# Patient Record
Sex: Female | Born: 1965 | Race: Black or African American | Hispanic: No | Marital: Married | Smoking: Never smoker
Health system: Southern US, Community
[De-identification: ages and names within clinical notes are randomized; demographics above are authoritative.]

## PROBLEM LIST (undated history)

## (undated) DIAGNOSIS — M1990 Unspecified osteoarthritis, unspecified site: Secondary | ICD-10-CM

---

## 2018-09-07 ENCOUNTER — Emergency Department (HOSPITAL_COMMUNITY)
Admission: EM | Admit: 2018-09-07 | Discharge: 2018-09-07 | Disposition: A | Discharge status: Home / Self Care | Payer: Medicaid - Out of State | Attending: Emergency Medicine | Admitting: Emergency Medicine

## 2018-09-07 ENCOUNTER — Encounter (HOSPITAL_COMMUNITY): Payer: Self-pay | Admitting: Emergency Medicine

## 2018-09-07 ENCOUNTER — Other Ambulatory Visit: Payer: Self-pay

## 2018-09-07 DIAGNOSIS — M546 Pain in thoracic spine: Secondary | ICD-10-CM | POA: Insufficient documentation

## 2018-09-07 HISTORY — DX: Unspecified osteoarthritis, unspecified site: M19.90

## 2018-09-07 LAB — CBC
HCT: 40.1 % (ref 36.0–46.0)
Hemoglobin: 12.7 g/dL (ref 12.0–15.0)
MCH: 30.8 pg (ref 26.0–34.0)
MCHC: 31.7 g/dL (ref 30.0–36.0)
MCV: 97.3 fL (ref 80.0–100.0)
Platelets: 300 10*3/uL (ref 150–400)
RBC: 4.12 MIL/uL (ref 3.87–5.11)
RDW: 12.6 % (ref 11.5–15.5)
WBC: 11.7 10*3/uL — ABNORMAL HIGH (ref 4.0–10.5)
nRBC: 0 % (ref 0.0–0.2)

## 2018-09-07 LAB — COMPREHENSIVE METABOLIC PANEL
ALT: 17 U/L (ref 0–44)
AST: 19 U/L (ref 15–41)
Albumin: 3.9 g/dL (ref 3.5–5.0)
Alkaline Phosphatase: 60 U/L (ref 38–126)
Anion gap: 7 (ref 5–15)
BUN: 10 mg/dL (ref 6–20)
CO2: 27 mmol/L (ref 22–32)
Calcium: 9.7 mg/dL (ref 8.9–10.3)
Chloride: 107 mmol/L (ref 98–111)
Creatinine, Ser: 0.79 mg/dL (ref 0.44–1.00)
GFR calc Af Amer: >60 mL/min (ref >60)
GFR calc non Af Amer: >60 mL/min (ref >60)
Glucose, Bld: 87 mg/dL (ref 70–99)
Potassium: 3.7 mmol/L (ref 3.5–5.1)
Sodium: 141 mmol/L (ref 135–145)
Total Bilirubin: 0.3 mg/dL (ref 0.3–1.2)
Total Protein: 7.7 g/dL (ref 6.5–8.1)

## 2018-09-07 LAB — LIPASE, BLOOD: Lipase: 39 U/L (ref 11–51)

## 2018-09-07 LAB — I-STAT TROPONIN, ED: Troponin i, poc: 0 ng/mL (ref 0.00–0.08)

## 2018-09-07 MED ORDER — METHOCARBAMOL 500 MG PO TABS: 1000.0000 mg | ORAL_TABLET | Freq: Four times a day (QID) | ORAL | 0 refills | Status: DC

## 2018-09-07 MED ORDER — NAPROXEN 500 MG PO TABS: 500.0000 mg | ORAL_TABLET | Freq: Two times a day (BID) | ORAL | 0 refills | Status: DC

## 2018-09-07 MED ORDER — KETOROLAC TROMETHAMINE 30 MG/ML IJ SOLN
30.0000 mg | Freq: Once | INTRAMUSCULAR | Status: AC
  Administered 2018-09-07: 30 mg via INTRAMUSCULAR
  Filled 2018-09-07: qty 1

## 2018-09-07 NOTE — ED Triage Notes (Signed)
Pt complaint of mid back/abdominal pain onset Saturday; worse with movement; denies n/v/d. Denies strenuous activity or back pain.

## 2018-09-07 NOTE — ED Provider Notes (Signed)
[facility]-EMERGENCY DEPT Provider Note   CSN: [id number] Arrival date & time: 09/07/18  1221     History   Chief Complaint Chief Complaint  Patient presents with  . Abdominal Pain  . Back Pain    HPI Melissa Wu is a 52 y.o. female.  Patient presents the emergency department with 3-day history of bilateral middle back pain that radiates around to the top of her abdomen.  Patient states that prior to this she had a sinus infection and was sneezing a lot.  She denies any acute injuries to the area.  She denies any chest pain, shortness of breath, diaphoresis.  She is not had any numbness, weakness, or tingling in her arms or her legs.  Pain is much worse with movement and palpation.  She has taken Tylenol and applied CBD cream with minimal relief.  No fever or cough.  Patient is ambulatory without any difficulty.     Past Medical History:  Diagnosis Date  . Osteoarthritis     There are no active problems to display for this patient.   The histories are not reviewed yet. Please review them in the "History" navigator section and refresh this SmartLink.   OB History   No obstetric history on file.      Home Medications    Prior to Admission medications   Medication Sig Start Date End Date Taking? Authorizing Provider  methocarbamol (ROBAXIN) 500 MG tablet Take 2 tablets (1,000 mg total) by mouth 4 (four) times daily. 09/07/18   Renne CriglerGeiple, Safi Culotta, PA-C  naproxen (NAPROSYN) 500 MG tablet Take 1 tablet (500 mg total) by mouth 2 (two) times daily. 09/07/18   Renne CriglerGeiple, Tangi Shroff, PA-C    Family History No family history on file.  Social History Social History   Tobacco Use  . Smoking status: Not on file  Substance Use Topics  . Alcohol use: Not on file  . Drug use: Not on file     Allergies   Patient has no allergy information on record.   Review of Systems Review of Systems  Constitutional: Negative for fever and unexpected weight change.   Gastrointestinal: Positive for abdominal pain. Negative for constipation.       Negative for fecal incontinence.   Genitourinary: Negative for dysuria, flank pain and hematuria.       Negative for urinary incontinence or retention.  Musculoskeletal: Positive for back pain.  Neurological: Negative for weakness and numbness.       Denies saddle paresthesias.     Physical Exam Updated Vital Signs BP (!) 142/113 (BP Location: Right Arm)   Pulse 86   Temp 98.2 F (36.8 C) (Oral)   Resp 18   SpO2 99%   Physical Exam Vitals signs and nursing note reviewed.  Constitutional:      Appearance: She is well-developed.  HENT:     Head: Normocephalic and atraumatic.  Eyes:     Conjunctiva/sclera: Conjunctivae normal.  Neck:     Musculoskeletal: Normal range of motion and neck supple.  Pulmonary:     Effort: Pulmonary effort is normal.  Abdominal:     Palpations: Abdomen is soft.     Tenderness: There is no abdominal tenderness.  Musculoskeletal:     Thoracic back: She exhibits decreased range of motion, tenderness and spasm. She exhibits no bony tenderness.       Back:     Comments: No step-off noted with palpation of spine.   Skin:    General: Skin  is warm and dry.     Findings: No rash.  Neurological:     Mental Status: She is alert.     Sensory: No sensory deficit.     Deep Tendon Reflexes: Reflexes are normal and symmetric.     Comments: 5/5 strength in entire lower extremities bilaterally. No sensation deficit.       ED Treatments / Results  Labs (all labs ordered are listed, but only abnormal results are displayed) Labs Reviewed  CBC - Abnormal; Notable for the following components:      Result Value   WBC 11.7 (*)    All other components within normal limits  LIPASE, BLOOD  COMPREHENSIVE METABOLIC PANEL  I-STAT TROPONIN, ED   ED ECG REPORT   Date: 09/07/2018  Rate: 65  Rhythm: normal sinus rhythm  QRS Axis: normal  Intervals: normal  ST/T Wave  abnormalities: normal  Conduction Disutrbances:none  Narrative Interpretation:   Old EKG Reviewed: none available  I have personally reviewed the EKG tracing and agree with the computerized printout as noted.   Radiology No results found.  Procedures Procedures (including critical care time)  Medications Ordered in ED Medications  ketorolac (TORADOL) 30 MG/ML injection 30 mg (has no administration in time range)     Initial Impression / Assessment and Plan / ED Course  I have reviewed the triage vital signs and the nursing notes.  Pertinent labs & imaging results that were available during my care of the patient were reviewed by me and considered in my medical decision making (see chart for details).     Patient seen and examined.  Symptoms consistent with musculoskeletal pain.  Work-up reviewed and is reassuring.  We will treat conservatively with Robaxin and NSAIDs.  Also encouraged heat, gentle stretching, and rest.  Vital signs reviewed and are as follows: BP (!) 142/113 (BP Location: Right Arm)   Pulse 86   Temp 98.2 F (36.8 C) (Oral)   Resp 18   SpO2 99%   Patient counseled on proper use of muscle relaxant medication.  They were told not to drink alcohol, drive any vehicle, or do any dangerous activities while taking this medication.  Patient verbalized understanding.   Final Clinical Impressions(s) / ED Diagnoses   Final diagnoses:  Acute bilateral thoracic back pain   Patient with back pain.  Possibly from overuse due to recent upper respiratory infection.  No neurological deficits. Patient is ambulatory. No warning symptoms of back pain including: fecal incontinence, urinary retention or overflow incontinence, night sweats, waking from sleep with back pain, unexplained fevers or weight loss, h/o cancer, IVDU, recent trauma. No concern for cauda equina, epidural abscess, or other serious cause of back pain. Conservative measures such as rest, ice/heat and pain  medicine indicated with PCP follow-up if no improvement with conservative management.  Work-up was reassuring with only mild leukocytosis.  Patient has no abdominal pain at time of exam and no signs of skin infection.   ED Discharge Orders         Ordered    methocarbamol (ROBAXIN) 500 MG tablet  4 times daily     09/07/18 1845    naproxen (NAPROSYN) 500 MG tablet  2 times daily     09/07/18 1845           Renne CriglerGeiple, Kaylee Wombles, PA-C 09/07/18 1857    Tilden Fossaees, Elizabeth, MD 09/07/18 2352

## 2018-09-07 NOTE — Discharge Instructions (Signed)
Please read and follow all provided instructions.  Your diagnoses today include:  1. Acute bilateral thoracic back pain     Tests performed today include:  Vital signs - see below for your results today  Blood counts and electrolytes  EKG  Blood test for your heart -not show any signs of heart attack  Medications prescribed:   Robaxin (methocarbamol) - muscle relaxer medication  DO NOT drive or perform any activities that require you to be awake and alert because this medicine can make you drowsy.    Naproxen - anti-inflammatory pain medication  Do not exceed 500mg  naproxen every 12 hours, take with food  You have been prescribed an anti-inflammatory medication or NSAID. Take with food. Take smallest effective dose for the shortest duration needed for your pain. Stop taking if you experience stomach pain or vomiting.   Take any prescribed medications only as directed.  Home care instructions:   Follow any educational materials contained in this packet  Please rest, use ice or heat on your back for the next several days  Do not lift, push, pull anything more than 10 pounds for the next week  Follow-up instructions: Please follow-up with your primary care provider in the next 1 week for further evaluation of your symptoms.   Return instructions:  SEEK IMMEDIATE MEDICAL ATTENTION IF YOU HAVE:  New numbness, tingling, weakness, or problem with the use of your arms or legs  Severe back pain not relieved with medications  Loss control of your bowels or bladder  Increasing pain in any areas of the body (such as chest or abdominal pain)  Shortness of breath, dizziness, or fainting.   Worsening nausea (feeling sick to your stomach), vomiting, fever, or sweats  Any other emergent concerns regarding your health   Additional Information:  Your vital signs today were: BP (!) 142/113 (BP Location: Right Arm)    Pulse 86    Temp 98.2 F (36.8 C) (Oral)    Resp 18     SpO2 99%  If your blood pressure (BP) was elevated above 135/85 this visit, please have this repeated by your doctor within one month. --------------

## 2018-11-17 ENCOUNTER — Other Ambulatory Visit: Payer: Self-pay | Admitting: Family Medicine

## 2018-11-17 DIAGNOSIS — Z1231 Encounter for screening mammogram for malignant neoplasm of breast: Secondary | ICD-10-CM

## 2018-11-17 DIAGNOSIS — E2839 Other primary ovarian failure: Secondary | ICD-10-CM

## 2019-01-14 ENCOUNTER — Ambulatory Visit: Payer: Medicaid - Out of State

## 2019-01-14 ENCOUNTER — Other Ambulatory Visit: Payer: Medicaid - Out of State

## 2019-12-15 ENCOUNTER — Other Ambulatory Visit: Payer: Self-pay | Admitting: Family Medicine

## 2019-12-15 DIAGNOSIS — E2839 Other primary ovarian failure: Secondary | ICD-10-CM

## 2020-01-06 ENCOUNTER — Ambulatory Visit
Admission: RE | Admit: 2020-01-06 | Discharge: 2020-01-06 | Disposition: A | Discharge status: Home / Self Care | Payer: Medicaid Other | Source: Ambulatory Visit | Attending: Family Medicine | Admitting: Family Medicine

## 2020-01-06 ENCOUNTER — Other Ambulatory Visit: Payer: Self-pay

## 2020-01-06 DIAGNOSIS — Z1231 Encounter for screening mammogram for malignant neoplasm of breast: Secondary | ICD-10-CM

## 2020-04-10 ENCOUNTER — Other Ambulatory Visit: Payer: Self-pay

## 2020-04-10 ENCOUNTER — Ambulatory Visit
Admission: RE | Admit: 2020-04-10 | Discharge: 2020-04-10 | Disposition: A | Discharge status: Home / Self Care | Payer: Medicaid Other | Source: Ambulatory Visit | Attending: Family Medicine | Admitting: Family Medicine

## 2020-04-10 DIAGNOSIS — E2839 Other primary ovarian failure: Secondary | ICD-10-CM

## 2021-01-03 ENCOUNTER — Other Ambulatory Visit: Payer: Self-pay | Admitting: Family Medicine

## 2021-01-03 DIAGNOSIS — Z1231 Encounter for screening mammogram for malignant neoplasm of breast: Secondary | ICD-10-CM

## 2021-02-23 ENCOUNTER — Ambulatory Visit: Payer: Medicaid Other

## 2021-06-19 ENCOUNTER — Ambulatory Visit
Admission: RE | Admit: 2021-06-19 | Discharge: 2021-06-19 | Disposition: A | Discharge status: Home / Self Care | Payer: Medicaid Other | Source: Ambulatory Visit | Attending: Family Medicine | Admitting: Family Medicine

## 2021-06-19 ENCOUNTER — Other Ambulatory Visit: Payer: Self-pay

## 2021-06-19 DIAGNOSIS — Z1231 Encounter for screening mammogram for malignant neoplasm of breast: Secondary | ICD-10-CM

## 2021-11-27 ENCOUNTER — Other Ambulatory Visit: Payer: Self-pay | Admitting: Obstetrics and Gynecology

## 2021-11-27 DIAGNOSIS — M8580 Other specified disorders of bone density and structure, unspecified site: Secondary | ICD-10-CM

## 2022-03-27 IMAGING — MG MM DIGITAL SCREENING BILAT W/ TOMO AND CAD
8 series · 8 of 24 positions shown · non-contrast
Comparison: Previous exam(s).

CLINICAL DATA: Screening.

EXAM:
DIGITAL SCREENING BILATERAL MAMMOGRAM WITH TOMOSYNTHESIS AND CAD
TECHNIQUE: Bilateral screening digital craniocaudal and mediolateral oblique
mammograms were obtained. Bilateral screening digital breast
tomosynthesis was performed. The images were evaluated with
computer-aided detection.

[L MLO synth-2D]
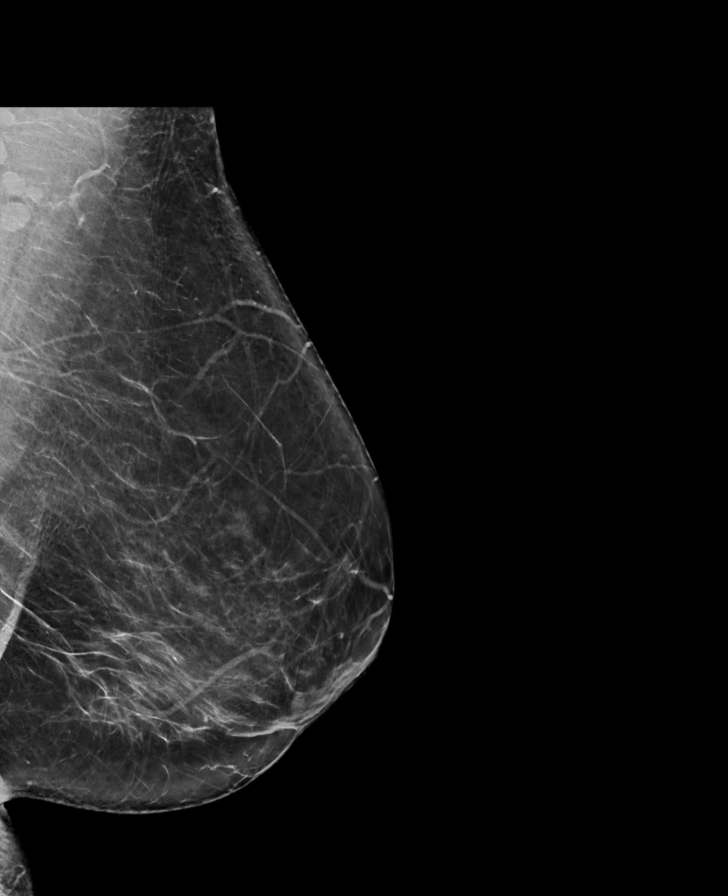

[R MLO synth-2D]
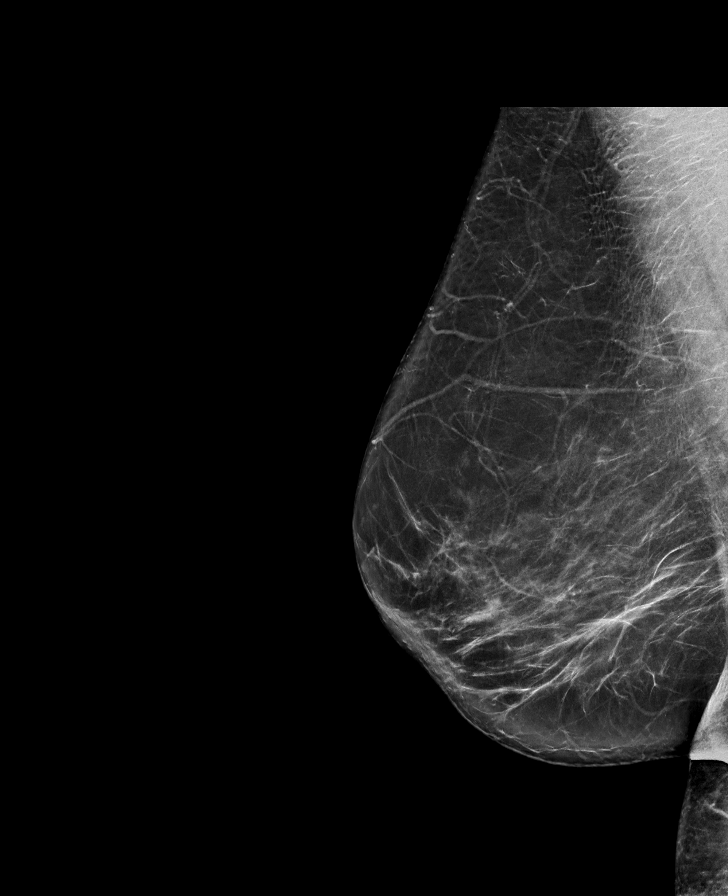

[R CC synth-2D]
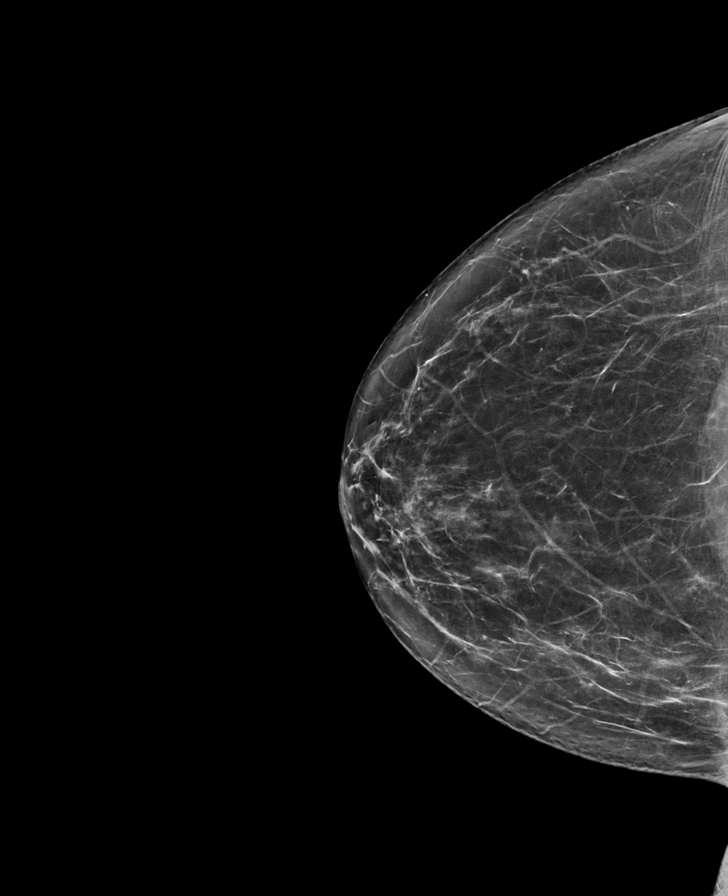

[L CC synth-2D]
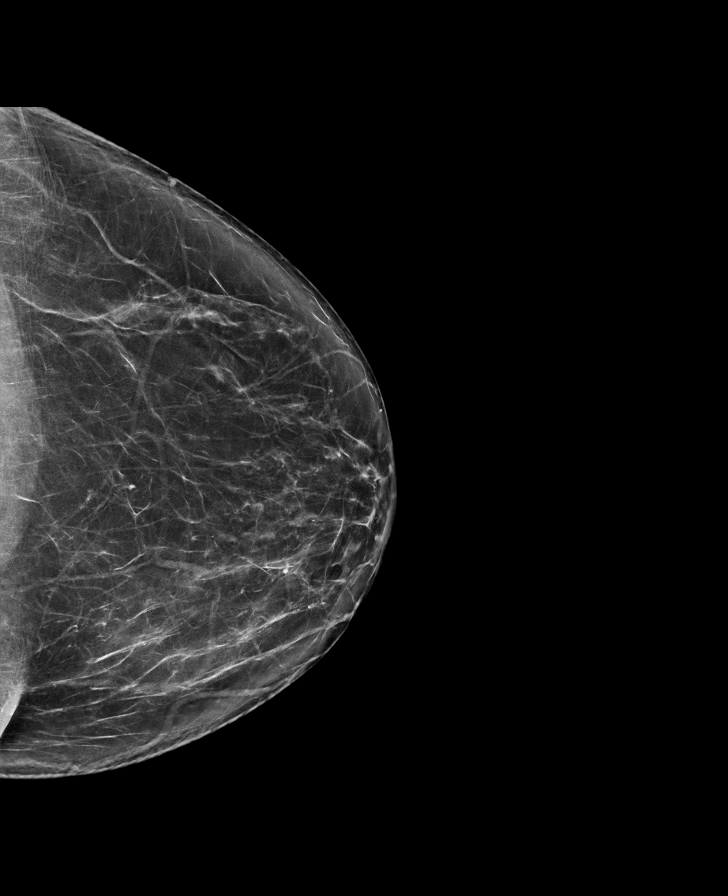

[L CC tomo · tomo slice 41/80.0]
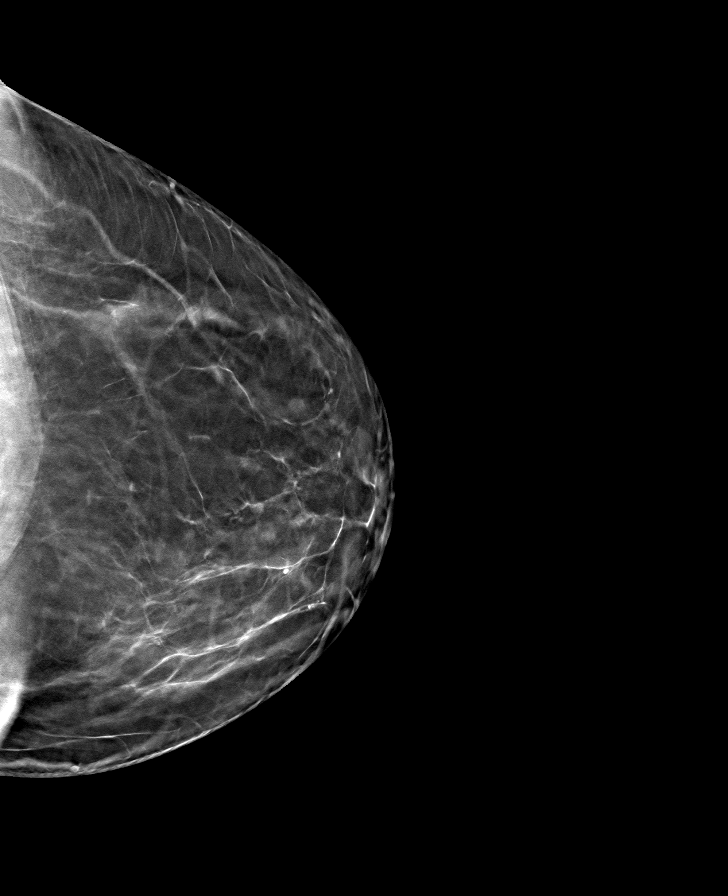

[R CC tomo · tomo slice 39/77.0]
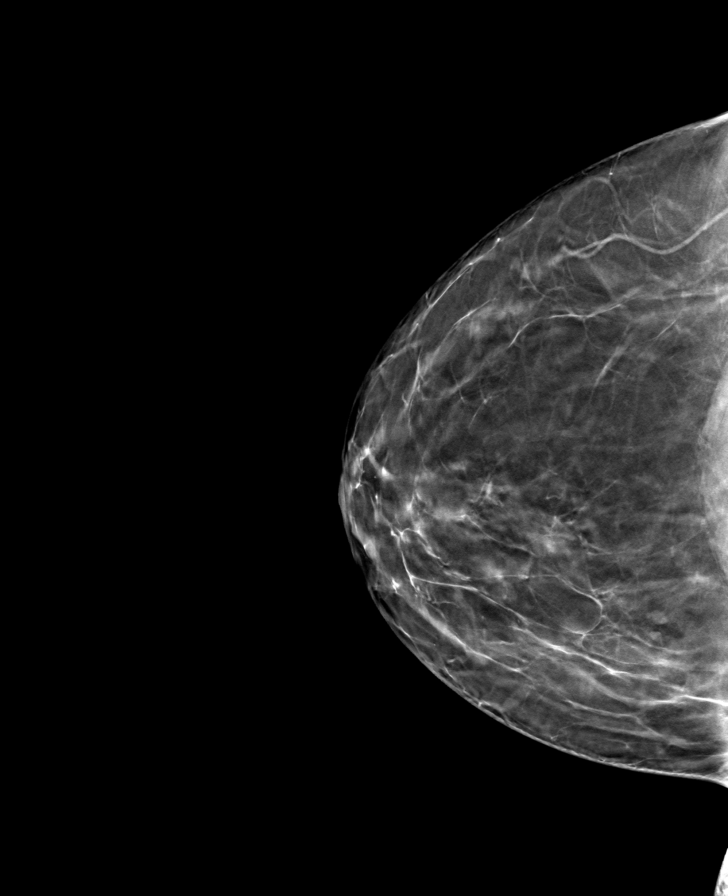

[R MLO tomo · tomo slice 44/87.0]
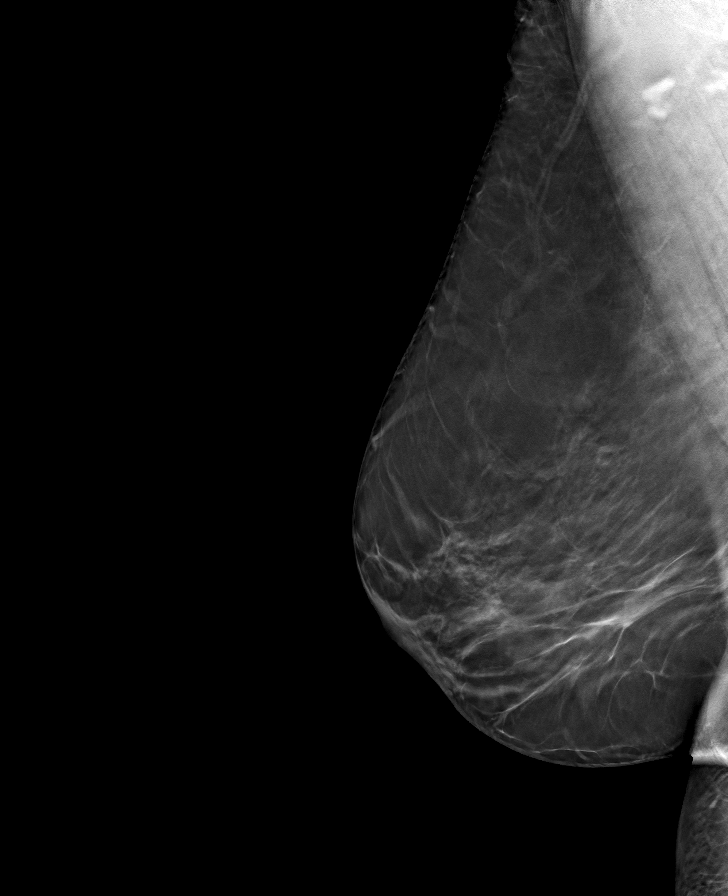

[L MLO tomo · tomo slice 43/85.0]
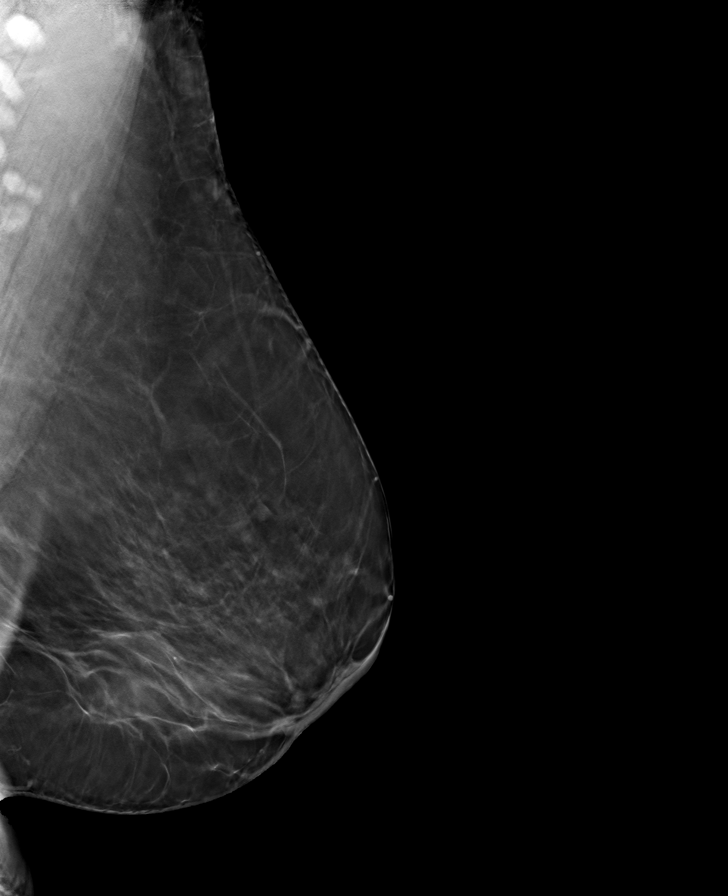

[8 of 24 positions shown; findings below may reference images not displayed]

ACR Breast Density Category b: There are scattered areas of
fibroglandular density.
FINDINGS: There are no findings suspicious for malignancy.
IMPRESSION: No mammographic evidence of malignancy. A result letter of this
screening mammogram will be mailed directly to the patient.

RECOMMENDATION:
Screening mammogram in one year. (Code:51-O-LD2)

BI-RADS CATEGORY  1: Negative.

## 2022-05-10 ENCOUNTER — Other Ambulatory Visit: Payer: Medicaid Other

## 2022-08-16 ENCOUNTER — Other Ambulatory Visit: Payer: Self-pay | Admitting: Obstetrics and Gynecology

## 2022-08-16 DIAGNOSIS — Z1231 Encounter for screening mammogram for malignant neoplasm of breast: Secondary | ICD-10-CM

## 2022-09-20 ENCOUNTER — Inpatient Hospital Stay: Admission: RE | Admit: 2022-09-20 | Payer: Medicaid Other | Source: Ambulatory Visit

## 2022-10-01 ENCOUNTER — Ambulatory Visit
Admission: RE | Admit: 2022-10-01 | Discharge: 2022-10-01 | Disposition: A | Discharge status: Home / Self Care | Payer: Medicaid Other | Source: Ambulatory Visit | Attending: Obstetrics and Gynecology | Admitting: Obstetrics and Gynecology

## 2022-10-01 DIAGNOSIS — M8580 Other specified disorders of bone density and structure, unspecified site: Secondary | ICD-10-CM

## 2022-10-14 ENCOUNTER — Ambulatory Visit
Admission: RE | Admit: 2022-10-14 | Discharge: 2022-10-14 | Disposition: A | Discharge status: Home / Self Care | Payer: Medicaid Other | Source: Ambulatory Visit | Attending: Obstetrics and Gynecology | Admitting: Obstetrics and Gynecology

## 2022-10-14 DIAGNOSIS — Z1231 Encounter for screening mammogram for malignant neoplasm of breast: Secondary | ICD-10-CM

## 2023-03-19 DIAGNOSIS — Z780 Asymptomatic menopausal state: Secondary | ICD-10-CM | POA: Diagnosis not present

## 2023-03-19 DIAGNOSIS — M8580 Other specified disorders of bone density and structure, unspecified site: Secondary | ICD-10-CM | POA: Diagnosis not present

## 2023-03-19 DIAGNOSIS — Z01419 Encounter for gynecological examination (general) (routine) without abnormal findings: Secondary | ICD-10-CM | POA: Diagnosis not present

## 2023-05-04 DIAGNOSIS — Z809 Family history of malignant neoplasm, unspecified: Secondary | ICD-10-CM | POA: Diagnosis not present

## 2023-05-04 DIAGNOSIS — Z5986 Financial insecurity: Secondary | ICD-10-CM | POA: Diagnosis not present

## 2023-05-04 DIAGNOSIS — Z880 Allergy status to penicillin: Secondary | ICD-10-CM | POA: Diagnosis not present

## 2023-05-04 DIAGNOSIS — Z5948 Other specified lack of adequate food: Secondary | ICD-10-CM | POA: Diagnosis not present

## 2023-05-04 DIAGNOSIS — Z833 Family history of diabetes mellitus: Secondary | ICD-10-CM | POA: Diagnosis not present

## 2023-05-04 DIAGNOSIS — E669 Obesity, unspecified: Secondary | ICD-10-CM | POA: Diagnosis not present

## 2023-05-04 DIAGNOSIS — Z6831 Body mass index (BMI) 31.0-31.9, adult: Secondary | ICD-10-CM | POA: Diagnosis not present

## 2023-09-15 ENCOUNTER — Other Ambulatory Visit: Payer: Self-pay | Admitting: Family Medicine

## 2023-09-15 DIAGNOSIS — Z1231 Encounter for screening mammogram for malignant neoplasm of breast: Secondary | ICD-10-CM

## 2023-10-16 ENCOUNTER — Ambulatory Visit
Admission: RE | Admit: 2023-10-16 | Discharge: 2023-10-16 | Disposition: A | Discharge status: Home / Self Care | Payer: Medicaid Other | Source: Ambulatory Visit | Attending: Family Medicine | Admitting: Family Medicine

## 2023-10-16 DIAGNOSIS — Z1231 Encounter for screening mammogram for malignant neoplasm of breast: Secondary | ICD-10-CM

## 2024-02-12 ENCOUNTER — Other Ambulatory Visit (HOSPITAL_BASED_OUTPATIENT_CLINIC_OR_DEPARTMENT_OTHER): Payer: Self-pay | Admitting: Family Medicine

## 2024-02-12 DIAGNOSIS — E785 Hyperlipidemia, unspecified: Secondary | ICD-10-CM

## 2024-02-16 ENCOUNTER — Other Ambulatory Visit (HOSPITAL_BASED_OUTPATIENT_CLINIC_OR_DEPARTMENT_OTHER)

## 2024-03-04 ENCOUNTER — Other Ambulatory Visit (HOSPITAL_BASED_OUTPATIENT_CLINIC_OR_DEPARTMENT_OTHER)

## 2024-03-04 ENCOUNTER — Encounter (HOSPITAL_BASED_OUTPATIENT_CLINIC_OR_DEPARTMENT_OTHER): Payer: Self-pay

## 2024-05-21 ENCOUNTER — Encounter (HOSPITAL_COMMUNITY): Payer: Self-pay

## 2024-05-21 ENCOUNTER — Inpatient Hospital Stay (HOSPITAL_COMMUNITY)
Admission: EM | Admit: 2024-05-21 | Discharge: 2024-05-30 | DRG: 871 | Disposition: A | Discharge status: Home / Self Care | Attending: Surgery | Admitting: Surgery

## 2024-05-21 ENCOUNTER — Other Ambulatory Visit: Payer: Self-pay

## 2024-05-21 DIAGNOSIS — K8020 Calculus of gallbladder without cholecystitis without obstruction: Secondary | ICD-10-CM | POA: Diagnosis present

## 2024-05-21 DIAGNOSIS — R6520 Severe sepsis without septic shock: Secondary | ICD-10-CM | POA: Diagnosis present

## 2024-05-21 DIAGNOSIS — E663 Overweight: Secondary | ICD-10-CM | POA: Diagnosis present

## 2024-05-21 DIAGNOSIS — K3533 Acute appendicitis with perforation and localized peritonitis, with abscess: Secondary | ICD-10-CM | POA: Diagnosis not present

## 2024-05-21 DIAGNOSIS — E872 Acidosis, unspecified: Secondary | ICD-10-CM | POA: Diagnosis present

## 2024-05-21 DIAGNOSIS — Z880 Allergy status to penicillin: Secondary | ICD-10-CM

## 2024-05-21 DIAGNOSIS — A419 Sepsis, unspecified organism: Principal | ICD-10-CM | POA: Diagnosis present

## 2024-05-21 DIAGNOSIS — K3532 Acute appendicitis with perforation and localized peritonitis, without abscess: Principal | ICD-10-CM | POA: Diagnosis present

## 2024-05-21 DIAGNOSIS — Z6828 Body mass index (BMI) 28.0-28.9, adult: Secondary | ICD-10-CM

## 2024-05-21 LAB — CBC
HCT: 44.3 % (ref 36.0–46.0)
Hemoglobin: 14.3 g/dL (ref 12.0–15.0)
MCH: 31.2 pg (ref 26.0–34.0)
MCHC: 32.3 g/dL (ref 30.0–36.0)
MCV: 96.7 fL (ref 80.0–100.0)
Platelets: 320 K/uL (ref 150–400)
RBC: 4.58 MIL/uL (ref 3.87–5.11)
RDW: 13.2 % (ref 11.5–15.5)
WBC: 24.2 K/uL — ABNORMAL HIGH (ref 4.0–10.5)
nRBC: 0 % (ref 0.0–0.2)

## 2024-05-21 LAB — COMPREHENSIVE METABOLIC PANEL WITH GFR
ALT: 20 U/L (ref 0–44)
AST: 24 U/L (ref 15–41)
Albumin: 3.6 g/dL (ref 3.5–5.0)
Alkaline Phosphatase: 67 U/L (ref 38–126)
Anion gap: 14 (ref 5–15)
BUN: 7 mg/dL (ref 6–20)
CO2: 24 mmol/L (ref 22–32)
Calcium: 9.5 mg/dL (ref 8.9–10.3)
Chloride: 101 mmol/L (ref 98–111)
Creatinine, Ser: 0.9 mg/dL (ref 0.44–1.00)
GFR, Estimated: >60 mL/min (ref >60)
Glucose, Bld: 103 mg/dL — ABNORMAL HIGH (ref 70–99)
Potassium: 4 mmol/L (ref 3.5–5.1)
Sodium: 139 mmol/L (ref 135–145)
Total Bilirubin: 1 mg/dL (ref 0.0–1.2)
Total Protein: 7.3 g/dL (ref 6.5–8.1)

## 2024-05-21 LAB — URINALYSIS, ROUTINE W REFLEX MICROSCOPIC
Bilirubin Urine: NEGATIVE
Glucose, UA: NEGATIVE mg/dL
Ketones, ur: 20 mg/dL — AB
Leukocytes,Ua: NEGATIVE
Nitrite: NEGATIVE
Protein, ur: 100 mg/dL — AB
Specific Gravity, Urine: 1.041 — ABNORMAL HIGH (ref 1.005–1.030)
pH: 5 (ref 5.0–8.0)

## 2024-05-21 LAB — LIPASE, BLOOD: Lipase: 42 U/L (ref 11–51)

## 2024-05-21 NOTE — ED Triage Notes (Signed)
 Patient reports that she has lower abdominal pain, with the left side feeling like pain and the right side feels like burning, since this morning after having food poisoning yesterday with lots of vomiting. Emesis has resolved.

## 2024-05-22 ENCOUNTER — Encounter (HOSPITAL_COMMUNITY): Payer: Self-pay

## 2024-05-22 ENCOUNTER — Emergency Department (HOSPITAL_COMMUNITY)

## 2024-05-22 DIAGNOSIS — Z880 Allergy status to penicillin: Secondary | ICD-10-CM | POA: Diagnosis not present

## 2024-05-22 DIAGNOSIS — E663 Overweight: Secondary | ICD-10-CM | POA: Diagnosis present

## 2024-05-22 DIAGNOSIS — A419 Sepsis, unspecified organism: Secondary | ICD-10-CM | POA: Diagnosis present

## 2024-05-22 DIAGNOSIS — E872 Acidosis, unspecified: Secondary | ICD-10-CM | POA: Diagnosis present

## 2024-05-22 DIAGNOSIS — Z6828 Body mass index (BMI) 28.0-28.9, adult: Secondary | ICD-10-CM | POA: Diagnosis not present

## 2024-05-22 DIAGNOSIS — K8020 Calculus of gallbladder without cholecystitis without obstruction: Secondary | ICD-10-CM | POA: Diagnosis present

## 2024-05-22 DIAGNOSIS — K3532 Acute appendicitis with perforation and localized peritonitis, without abscess: Secondary | ICD-10-CM | POA: Diagnosis present

## 2024-05-22 DIAGNOSIS — R1031 Right lower quadrant pain: Secondary | ICD-10-CM | POA: Diagnosis present

## 2024-05-22 DIAGNOSIS — R6520 Severe sepsis without septic shock: Secondary | ICD-10-CM | POA: Diagnosis present

## 2024-05-22 DIAGNOSIS — K3533 Acute appendicitis with perforation and localized peritonitis, with abscess: Secondary | ICD-10-CM | POA: Diagnosis not present

## 2024-05-22 LAB — CBC
HCT: 37.3 % (ref 36.0–46.0)
Hemoglobin: 12.1 g/dL (ref 12.0–15.0)
MCH: 31.6 pg (ref 26.0–34.0)
MCHC: 32.4 g/dL (ref 30.0–36.0)
MCV: 97.4 fL (ref 80.0–100.0)
Platelets: 263 K/uL (ref 150–400)
RBC: 3.83 MIL/uL — ABNORMAL LOW (ref 3.87–5.11)
RDW: 13.3 % (ref 11.5–15.5)
WBC: 24.7 K/uL — ABNORMAL HIGH (ref 4.0–10.5)
nRBC: 0 % (ref 0.0–0.2)

## 2024-05-22 LAB — PROTIME-INR
INR: 1.3 — ABNORMAL HIGH (ref 0.8–1.2)
Prothrombin Time: 17 s — ABNORMAL HIGH (ref 11.4–15.2)

## 2024-05-22 LAB — I-STAT CG4 LACTIC ACID, ED
Lactic Acid, Venous: 2.5 mmol/L (ref 0.5–1.9)
Lactic Acid, Venous: 1.1 mmol/L (ref 0.5–1.9)

## 2024-05-22 LAB — RESP PANEL BY RT-PCR (RSV, FLU A&B, COVID)  RVPGX2
Influenza A by PCR: NEGATIVE
Influenza B by PCR: NEGATIVE
Resp Syncytial Virus by PCR: NEGATIVE
SARS Coronavirus 2 by RT PCR: NEGATIVE

## 2024-05-22 LAB — CREATININE, SERUM
Creatinine, Ser: 0.79 mg/dL (ref 0.44–1.00)
GFR, Estimated: >60 mL/min (ref >60)

## 2024-05-22 LAB — HIV ANTIBODY (ROUTINE TESTING W REFLEX): HIV Screen 4th Generation wRfx: NONREACTIVE

## 2024-05-22 MED ORDER — METOPROLOL TARTRATE 5 MG/5ML IV SOLN: 5.0000 mg | Freq: Four times a day (QID) | INTRAVENOUS | Status: DC | PRN

## 2024-05-22 MED ORDER — LACTATED RINGERS IV BOLUS
500.0000 mL | Freq: Once | INTRAVENOUS | Status: AC
Start: 2024-05-23 — End: 2024-05-22
  Administered 2024-05-22: 500 mL via INTRAVENOUS

## 2024-05-22 MED ORDER — ONDANSETRON 4 MG PO TBDP: 4.0000 mg | ORAL_TABLET | Freq: Four times a day (QID) | ORAL | Status: DC | PRN

## 2024-05-22 MED ORDER — DIPHENHYDRAMINE HCL 25 MG PO CAPS: 25.0000 mg | ORAL_CAPSULE | Freq: Four times a day (QID) | ORAL | Status: DC | PRN

## 2024-05-22 MED ORDER — ENOXAPARIN SODIUM 40 MG/0.4ML IJ SOSY
40.0000 mg | PREFILLED_SYRINGE | INTRAMUSCULAR | Status: DC
  Administered 2024-05-22 – 2024-05-29 (×7): 40 mg via SUBCUTANEOUS
  Filled 2024-05-22 (×8): qty 0.4

## 2024-05-22 MED ORDER — HYDROMORPHONE HCL 1 MG/ML IJ SOLN
0.5000 mg | INTRAMUSCULAR | Status: DC | PRN
  Administered 2024-05-24 – 2024-05-25 (×2): 0.5 mg via INTRAVENOUS
  Filled 2024-05-22 (×2): qty 0.5

## 2024-05-22 MED ORDER — SODIUM CHLORIDE 0.9 % IV BOLUS (SEPSIS)
1000.0000 mL | Freq: Once | INTRAVENOUS | Status: AC
  Administered 2024-05-22: 1000 mL via INTRAVENOUS

## 2024-05-22 MED ORDER — LACTATED RINGERS IV SOLN: INTRAVENOUS | Status: AC

## 2024-05-22 MED ORDER — POLYETHYLENE GLYCOL 3350 17 G PO PACK: 17.0000 g | PACK | Freq: Every day | ORAL | Status: DC | PRN

## 2024-05-22 MED ORDER — METRONIDAZOLE 500 MG/100ML IV SOLN
500.0000 mg | Freq: Once | INTRAVENOUS | Status: AC
  Administered 2024-05-22: 500 mg via INTRAVENOUS
  Filled 2024-05-22: qty 100

## 2024-05-22 MED ORDER — MORPHINE SULFATE (PF) 4 MG/ML IV SOLN
4.0000 mg | Freq: Once | INTRAVENOUS | Status: AC
  Administered 2024-05-22: 4 mg via INTRAVENOUS
  Filled 2024-05-22: qty 1

## 2024-05-22 MED ORDER — OXYCODONE HCL 5 MG PO TABS
5.0000 mg | ORAL_TABLET | ORAL | Status: DC | PRN
  Administered 2024-05-23 – 2024-05-24 (×7): 5 mg via ORAL
  Administered 2024-05-25 – 2024-05-26 (×2): 10 mg via ORAL
  Filled 2024-05-22 (×3): qty 1
  Filled 2024-05-22: qty 2
  Filled 2024-05-22 (×4): qty 1
  Filled 2024-05-22: qty 2

## 2024-05-22 MED ORDER — ACETAMINOPHEN 650 MG RE SUPP: 650.0000 mg | Freq: Four times a day (QID) | RECTAL | Status: DC | PRN

## 2024-05-22 MED ORDER — DIPHENHYDRAMINE HCL 50 MG/ML IJ SOLN: 25.0000 mg | Freq: Four times a day (QID) | INTRAMUSCULAR | Status: DC | PRN

## 2024-05-22 MED ORDER — ONDANSETRON HCL 4 MG/2ML IJ SOLN
4.0000 mg | Freq: Four times a day (QID) | INTRAMUSCULAR | Status: DC | PRN
  Administered 2024-05-24 – 2024-05-27 (×3): 4 mg via INTRAVENOUS
  Filled 2024-05-22 (×3): qty 2

## 2024-05-22 MED ORDER — SODIUM CHLORIDE 0.9 % IV SOLN
2.0000 g | Freq: Three times a day (TID) | INTRAVENOUS | Status: AC
  Administered 2024-05-22 – 2024-05-28 (×21): 2 g via INTRAVENOUS
  Filled 2024-05-22 (×21): qty 12.5

## 2024-05-22 MED ORDER — METRONIDAZOLE 500 MG/100ML IV SOLN
500.0000 mg | Freq: Two times a day (BID) | INTRAVENOUS | Status: AC
  Administered 2024-05-22 – 2024-05-29 (×14): 500 mg via INTRAVENOUS
  Filled 2024-05-22 (×14): qty 100

## 2024-05-22 MED ORDER — ONDANSETRON HCL 4 MG/2ML IJ SOLN
4.0000 mg | Freq: Once | INTRAMUSCULAR | Status: AC
  Administered 2024-05-22: 4 mg via INTRAVENOUS
  Filled 2024-05-22: qty 2

## 2024-05-22 MED ORDER — LEVOFLOXACIN IN D5W 750 MG/150ML IV SOLN
750.0000 mg | Freq: Once | INTRAVENOUS | Status: AC
  Administered 2024-05-22: 750 mg via INTRAVENOUS
  Filled 2024-05-22: qty 150

## 2024-05-22 MED ORDER — IOHEXOL 350 MG/ML SOLN
75.0000 mL | Freq: Once | INTRAVENOUS | Status: AC | PRN
  Administered 2024-05-22: 75 mL via INTRAVENOUS

## 2024-05-22 MED ORDER — SODIUM CHLORIDE 0.9 % IV BOLUS
1000.0000 mL | Freq: Once | INTRAVENOUS | Status: AC
  Administered 2024-05-22: 1000 mL via INTRAVENOUS

## 2024-05-22 MED ORDER — DOCUSATE SODIUM 100 MG PO CAPS
100.0000 mg | ORAL_CAPSULE | Freq: Two times a day (BID) | ORAL | Status: DC
  Administered 2024-05-22 – 2024-05-29 (×15): 100 mg via ORAL
  Filled 2024-05-22 (×16): qty 1

## 2024-05-22 MED ORDER — ACETAMINOPHEN 325 MG PO TABS
650.0000 mg | ORAL_TABLET | Freq: Four times a day (QID) | ORAL | Status: DC | PRN
  Administered 2024-05-22 – 2024-05-30 (×9): 650 mg via ORAL
  Filled 2024-05-22 (×10): qty 2

## 2024-05-22 NOTE — ED Notes (Signed)
 Patient transported to CT

## 2024-05-22 NOTE — Sepsis Progress Note (Signed)
 Notified bedside nurse of need to draw repeat lactic acid.

## 2024-05-22 NOTE — Progress Notes (Signed)
 Pt being followed by ELink for Sepsis protocol.

## 2024-05-22 NOTE — Consult Note (Signed)
 Reason for Consult/Chief Complaint: perf appendicitis Consultant: Franklyn, MD  Melissa Wu is an 58 y.o. female.   HPI: 1F with abdominal pain, n/v that began Thursday that she attributed to food poisoning. Single episode of diarrhea on Thursday. Last colonoscopy 4y ago with [facility]. Imaging with report of perforated appendicitis.   Past Medical History:  Diagnosis Date   Osteoarthritis     History reviewed. No pertinent surgical history.  History reviewed. No pertinent family history.  Social History:  has no history on file for tobacco use, alcohol use, and drug use.  Allergies:  Allergies  Allergen Reactions   Penicillins Hives and Swelling    Medications: I have reviewed the patient's current medications.  Results for orders placed or performed during the hospital encounter of 05/21/24 (from the past 48 hours)  Lipase, blood     Status: None   Collection Time: 05/21/24 10:22 PM  Result Value Ref Range   Lipase 42 11 - 51 U/L    Comment: Performed at [facility], [address], [place], [place] [address]  Comprehensive metabolic panel     Status: Abnormal   Collection Time: 05/21/24 10:22 PM  Result Value Ref Range   Sodium 139 135 - 145 mmol/L   Potassium 4.0 3.5 - 5.1 mmol/L   Chloride 101 98 - 111 mmol/L   CO2 24 22 - 32 mmol/L   Glucose, Bld 103 (H) 70 - 99 mg/dL    Comment: Glucose reference range applies only to samples taken after fasting for at least 8 hours.   BUN 7 6 - 20 mg/dL   Creatinine, Ser 9.09 0.44 - 1.00 mg/dL   Calcium 9.5 8.9 - 89.6 mg/dL   Total Protein 7.3 6.5 - 8.1 g/dL   Albumin 3.6 3.5 - 5.0 g/dL   AST 24 15 - 41 U/L   ALT 20 0 - 44 U/L   Alkaline Phosphatase 67 38 - 126 U/L   Total Bilirubin 1.0 0.0 - 1.2 mg/dL   GFR, Estimated >39 >39 mL/min    Comment: (NOTE) Calculated using the CKD-EPI Creatinine Equation (2021)    Anion gap 14 5 - 15    Comment: Performed at [facility], [address]  [place], [place] [address]  CBC     Status: Abnormal   Collection Time: 05/21/24 10:22 PM  Result Value Ref Range   WBC 24.2 (H) 4.0 - 10.5 K/uL   RBC 4.58 3.87 - 5.11 MIL/uL   Hemoglobin 14.3 12.0 - 15.0 g/dL   HCT 55.6 63.9 - 53.9 %   MCV 96.7 80.0 - 100.0 fL   MCH 31.2 26.0 - 34.0 pg   MCHC 32.3 30.0 - 36.0 g/dL   RDW 86.7 88.4 - 84.4 %   Platelets 320 150 - 400 K/uL   nRBC 0.0 0.0 - 0.2 %    Comment: Performed at [facility], [address], [place], [place] [address]  Urinalysis, Routine w reflex microscopic -Urine, Clean Catch     Status: Abnormal   Collection Time: 05/21/24 10:36 PM  Result Value Ref Range   Color, Urine AMBER (A) YELLOW    Comment: BIOCHEMICALS MAY BE AFFECTED BY COLOR   APPearance HAZY (A) CLEAR   Specific Gravity, Urine 1.041 (H) 1.005 - 1.030   pH 5.0 5.0 - 8.0   Glucose, UA NEGATIVE NEGATIVE mg/dL   Hgb urine dipstick MODERATE (A) NEGATIVE   Bilirubin Urine NEGATIVE NEGATIVE  Ketones, ur 20 (A) NEGATIVE mg/dL   Protein, ur 899 (A) NEGATIVE mg/dL   Nitrite NEGATIVE NEGATIVE   Leukocytes,Ua NEGATIVE NEGATIVE   RBC / HPF 21-50 0 - 5 RBC/hpf   WBC, UA 11-20 0 - 5 WBC/hpf   Bacteria, UA RARE (A) NONE SEEN   Squamous Epithelial / HPF 6-10 0 - 5 /HPF   Mucus PRESENT     Comment: Performed at [facility], [address] [place], [place] [address]  I-Stat Lactic Acid     Status: Abnormal   Collection Time: 05/22/24  5:06 AM  Result Value Ref Range   Lactic Acid, Venous 2.5 (HH) 0.5 - 1.9 mmol/L   Comment MD NOTIFIED, SUGGEST RECOLLECT     DG Chest Port 1 View Result Date: 05/22/2024 EXAM: 1 VIEW XRAY OF THE CHEST 05/22/2024 05:21:05 AM COMPARISON: None available. CLINICAL HISTORY: Questionable sepsis - evaluate for abnormality FINDINGS: LUNGS AND PLEURA: No focal pulmonary opacity. No pulmonary edema. No pleural effusion. No pneumothorax. HEART AND MEDIASTINUM: No acute abnormality of the cardiac and mediastinal silhouettes. BONES AND SOFT  TISSUES: No acute osseous abnormality. IMPRESSION: 1. No acute process. Electronically signed by: Waddell Calk MD 05/22/2024 05:46 AM EDT RP Workstation: [id number]   CT ABDOMEN PELVIS W CONTRAST Result Date: 05/22/2024 EXAM: CT ABDOMEN AND PELVIS WITH CONTRAST 05/22/2024 04:45:35 AM TECHNIQUE: CT of the abdomen and pelvis was performed with the administration of intravenous contrast. Multiplanar reformatted images are provided for review. Automated exposure control, iterative reconstruction, and/or weight-based adjustment of the mA/kV was utilized to reduce the radiation dose to as low as reasonably achievable. COMPARISON: None available. CLINICAL HISTORY: Abdominal pain, acute, nonlocalized. Pt threw up after contrast administration, repeated scan after she was done. FINDINGS: LOWER CHEST: Small hiatal hernia. LIVER: The liver is unremarkable. GALLBLADDER AND BILE DUCTS: Multiple stones are identified within the gallbladder measuring up to 1 cm. No gallbladder wall thickening or pericholecystic fluid. SPLEEN: No acute abnormality. PANCREAS: Pancreas appears normal without made duct dilatation, inflammation or mass. ADRENAL GLANDS: No acute abnormality. KIDNEYS, URETERS AND BLADDER: No stones in the kidneys or ureters. No hydronephrosis. No perinephric or periureteral stranding. Urinary bladder is unremarkable. GI AND BOWEL: Stomach is nondistended. The appendix is thickened with diffuse appendiceal wall thickening. Maximum diameter of the appendix is 1.5 cm. There is surrounding soft tissue stranding and thickening of the adjacent peritoneal reflection. Small foci of extraluminal gas noted within the right lower quadrant of the abdomen, axial image 58/4. No focal fluid collection identified to indicate presence of drainable abscess. PERITONEUM AND RETROPERITONEUM: Small volume of complex free fluid within the pelvis with overlying suggesting peritonitis. VASCULATURE: Aorta is normal in caliber. LYMPH NODES: No  lymphadenopathy. REPRODUCTIVE ORGANS: Calcified uterine fibroid identified. No adnexal mass. BONES AND SOFT TISSUES: No acute or suspicious osseous finding. IMPRESSION: 1. Acute. perforated appendicitis with surrounding soft tissue stranding, thickening of the adjacent peritoneal reflection, and small foci of extraluminal gas in the right lower quadrant. No drainable abscess identified. 2. Multiple gallstones, largest measuring 1 cm. No gallbladder wall thickening or pericholecystic fluid. 3. Small volume of complex free fluid within the pelvis, suggestive of peritonitis. 4. Critical findings were discussed with the ordering provider, Leita Chancy, at the time of interpretation on 05/22/2024 at 05:32 am Electronically signed by: Waddell Calk MD 05/22/2024 05:32 AM EDT RP Workstation: [id number]    ROS 10 point review of systems is negative except as listed above in HPI.   Physical Exam Blood  pressure 105/71, pulse (!) 116, temperature 99.7 F (37.6 C), temperature source Oral, resp. rate (!) 24, height 5' 5 (1.651 m), weight 78 kg, SpO2 96%. Constitutional: well-developed, well-nourished HEENT: pupils equal, round, reactive to light, 2mm b/l, moist conjunctiva, external inspection of ears and nose normal, hearing intact Oropharynx: normal oropharyngeal mucosa, normal dentition Neck: no thyromegaly, trachea midline, no midline cervical tenderness to palpation Chest: breath sounds equal bilaterally, normal respiratory effort, no midline or lateral chest wall tenderness to palpation/deformity Abdomen: soft, BLQ TTP, no bruising, no hepatosplenomegaly Skin: warm, dry, no rashes Psych: normal memory, normal mood/affect     Assessment/Plan: Perforated appendicitis - imaging quality is extremely poor. Recommend fluids and abx for now. Will give day team an opportunity to review imaging and make a decision regarding surgery, nonoperative management, or repeat CT scan.   FEN - NPO except  sips/chips Dispo - pending     Dreama GEANNIE Hanger, MD General and Trauma Surgery [facility]

## 2024-05-22 NOTE — Progress Notes (Signed)
 Progress Note: General Surgery Service   Chief Complaint/Subjective: Continued lower abdominal pain  Objective: Vital signs in last 24 hours: Temp:  [98.1 F (36.7 C)-99.7 F (37.6 C)] 98.3 F (36.8 C) (09/13 0735) Pulse Rate:  [82-116] 107 (09/13 0700) Resp:  [17-24] 17 (09/13 0700) BP: (103-114)/(61-71) 103/61 (09/13 0700) SpO2:  [94 %-99 %] 98 % (09/13 0700) Weight:  [78 kg] 78 kg (09/12 2210)    Intake/Output from previous day: No intake/output data recorded. Intake/Output this shift: Total I/O In: 2100 [IV Piggyback:2100] Out: -   Constitutional: NAD; conversant; no deformities Eyes: Moist conjunctiva; no lid lag; anicteric; PERRL Neck: Trachea midline; no thyromegaly Lungs: Normal respiratory effort; no tactile fremitus CV: RRR; no palpable thrills; no pitting edema GI: Abd Soft, tender lower quadrants bilaterally, worse on right; no palpable hepatosplenomegaly MSK: Normal range of motion of extremities; no clubbing/cyanosis Psychiatric: Appropriate affect; alert and oriented x3 Lymphatic: No palpable cervical or axillary lymphadenopathy  Lab Results: CBC  Recent Labs    05/21/24 2222  WBC 24.2*  HGB 14.3  HCT 44.3  PLT 320   BMET Recent Labs    05/21/24 2222  NA 139  K 4.0  CL 101  CO2 24  GLUCOSE 103*  BUN 7  CREATININE 0.90  CALCIUM 9.5   PT/INR Recent Labs    05/22/24 0539  LABPROT 17.0*  INR 1.3*   ABG No results for input(s): PHART, HCO3 in the last 72 hours.  Invalid input(s): PCO2, PO2  Anti-infectives: Anti-infectives (From admission, onward)    Start     Dose/Rate Route Frequency Ordered Stop   05/22/24 2000  metroNIDAZOLE  (FLAGYL ) IVPB 500 mg       Placed in And Linked Group   500 mg 100 mL/hr over 60 Minutes Intravenous Every 12 hours 05/22/24 0826 05/29/24 1959   05/22/24 0830  ceFEPIme  (MAXIPIME ) 2 g in sodium chloride  0.9 % 100 mL IVPB       Placed in And Linked Group   2 g 200 mL/hr over 30 Minutes  Intravenous Every 8 hours 05/22/24 0826 05/29/24 0559   05/22/24 0515  levofloxacin  (LEVAQUIN ) IVPB 750 mg        750 mg 100 mL/hr over 90 Minutes Intravenous  Once 05/22/24 0510 05/22/24 0837   05/22/24 0515  metroNIDAZOLE  (FLAGYL ) IVPB 500 mg        500 mg 100 mL/hr over 60 Minutes Intravenous  Once 05/22/24 0510 05/22/24 0703       Medications: Scheduled Meds:  docusate sodium   100 mg Oral BID   enoxaparin  (LOVENOX ) injection  40 mg Subcutaneous Q24H   Continuous Infusions:  ceFEPime  (MAXIPIME ) IV 2 g (05/22/24 0839)   And   metronidazole      lactated ringers  150 mL/hr at 05/22/24 0703   lactated ringers      PRN Meds:.acetaminophen  **OR** acetaminophen , diphenhydrAMINE  **OR** diphenhydrAMINE , HYDROmorphone  (DILAUDID ) injection, metoprolol  tartrate, ondansetron  **OR** ondansetron  (ZOFRAN ) IV, oxyCODONE , polyethylene glycol  Assessment/Plan: Melissa Wu is a 58 year old female with ruptured appendicitis.  I reviewed her history, physical exam, lab work and CT scan showing ruptured appendicitis.  I feel surgery or antibiotic treatment would be reasonable at this point.  I explained laparoscopic, possibly open, appendectomy. We discussed the procedure, its risks, benefits and alternatives.  Risks discussed included but were not limited to the risk of infection, bleeding, damage to nearby structures, postoperative abscess, need for open surgery or right colectomy.  We discussed the typical course of antibiotics and the  chance that she may need to have an IR drain placed, may end up needing an appendectomy this admission or may elect for interval appendectomy.  After a full discussion and all questions answered the patient voiced understanding and would like to proceed with antibiotic treatment of appendicitis.  The surgery team will follow her progress closely.   LOS: 0 days   FEN: NPO, IVF ID: Cefepime , Flagyl  VTE: Lovenox  Foley: None Dispo: Med / Surg floor    Melissa JINNY Foy, MD  [facility] Use [email or web address] to contact on call provider  Daily Billing: [id number] - High MDM

## 2024-05-22 NOTE — ED Provider Notes (Signed)
 [place] EMERGENCY DEPARTMENT AT [facility] Provider Note   CSN: [id number] Arrival date & time: 05/21/24  8042     Patient presents with: Abdominal Pain   Melissa Wu is a 58 y.o. female.   58 year old female presents with abdominal pain and vomiting. Patient reports waking Wednesday morning with mild abdominal discomfort. Pain progressed throughout the day, began vomiting Wednesday night. Also reports an episode of diarrhea on Thursday. Patient thought her symptoms were related to eating chicken salad from [facility] on Tuesday as her husband had 1 episode of emesis but he has been fine since.  Denies fevers, blood in emesis or stools. Prior abdominal surgery includes C-section x 2.       Prior to Admission medications   Medication Sig Start Date End Date Taking? Authorizing Provider  WEGOVY 1 MG/0.5ML SOAJ SQ injection Inject 1 mg into the skin once a week. Thursday 05/20/24  Yes [provider]    Allergies: Penicillins    Review of Systems Negative except as per HPI Updated Vital Signs BP 105/71   Pulse (!) 116   Temp 99.7 F (37.6 C) (Oral)   Resp (!) 24   Ht 5' 5 (1.651 m)   Wt 78 kg   SpO2 96%   BMI 28.62 kg/m   Physical Exam Vitals and nursing note reviewed.  Constitutional:      General: She is not in acute distress.    Appearance: She is well-developed. She is not diaphoretic.  HENT:     Head: Normocephalic and atraumatic.  Cardiovascular:     Rate and Rhythm: Regular rhythm. Tachycardia present.     Heart sounds: Normal heart sounds.  Pulmonary:     Effort: Pulmonary effort is normal.     Breath sounds: Normal breath sounds.  Abdominal:     Palpations: Abdomen is soft.     Tenderness: There is generalized abdominal tenderness and tenderness in the right lower quadrant, suprapubic area and left lower quadrant. There is guarding.  Skin:    General: Skin is warm and dry.     Findings: No erythema or rash.   Neurological:     Mental Status: She is alert and oriented to person, place, and time.  Psychiatric:        Behavior: Behavior normal.     (all labs ordered are listed, but only abnormal results are displayed) Labs Reviewed  COMPREHENSIVE METABOLIC PANEL WITH GFR - Abnormal; Notable for the following components:      Result Value   Glucose, Bld 103 (*)    All other components within normal limits  CBC - Abnormal; Notable for the following components:   WBC 24.2 (*)    All other components within normal limits  URINALYSIS, ROUTINE W REFLEX MICROSCOPIC - Abnormal; Notable for the following components:   Color, Urine AMBER (*)    APPearance HAZY (*)    Specific Gravity, Urine 1.041 (*)    Hgb urine dipstick MODERATE (*)    Ketones, ur 20 (*)    Protein, ur 100 (*)    Bacteria, UA RARE (*)    All other components within normal limits  I-STAT CG4 LACTIC ACID, ED - Abnormal; Notable for the following components:   Lactic Acid, Venous 2.5 (*)    All other components within normal limits  RESP PANEL BY RT-PCR (RSV, FLU A&B, COVID)  RVPGX2  CULTURE, BLOOD (ROUTINE X 2)  CULTURE, BLOOD (ROUTINE X 2)  URINE CULTURE  LIPASE, BLOOD  PROTIME-INR  I-STAT CG4 LACTIC ACID, ED    EKG: None  Radiology: DG Chest Port 1 View Result Date: 05/22/2024 EXAM: 1 VIEW XRAY OF THE CHEST 05/22/2024 05:21:05 AM COMPARISON: None available. CLINICAL HISTORY: Questionable sepsis - evaluate for abnormality FINDINGS: LUNGS AND PLEURA: No focal pulmonary opacity. No pulmonary edema. No pleural effusion. No pneumothorax. HEART AND MEDIASTINUM: No acute abnormality of the cardiac and mediastinal silhouettes. BONES AND SOFT TISSUES: No acute osseous abnormality. IMPRESSION: 1. No acute process. Electronically signed by: Waddell Calk MD 05/22/2024 05:46 AM EDT RP Workstation: [id number]   CT ABDOMEN PELVIS W CONTRAST Result Date: 05/22/2024 EXAM: CT ABDOMEN AND PELVIS WITH CONTRAST 05/22/2024 04:45:35 AM  TECHNIQUE: CT of the abdomen and pelvis was performed with the administration of intravenous contrast. Multiplanar reformatted images are provided for review. Automated exposure control, iterative reconstruction, and/or weight-based adjustment of the mA/kV was utilized to reduce the radiation dose to as low as reasonably achievable. COMPARISON: None available. CLINICAL HISTORY: Abdominal pain, acute, nonlocalized. Pt threw up after contrast administration, repeated scan after she was done. FINDINGS: LOWER CHEST: Small hiatal hernia. LIVER: The liver is unremarkable. GALLBLADDER AND BILE DUCTS: Multiple stones are identified within the gallbladder measuring up to 1 cm. No gallbladder wall thickening or pericholecystic fluid. SPLEEN: No acute abnormality. PANCREAS: Pancreas appears normal without made duct dilatation, inflammation or mass. ADRENAL GLANDS: No acute abnormality. KIDNEYS, URETERS AND BLADDER: No stones in the kidneys or ureters. No hydronephrosis. No perinephric or periureteral stranding. Urinary bladder is unremarkable. GI AND BOWEL: Stomach is nondistended. The appendix is thickened with diffuse appendiceal wall thickening. Maximum diameter of the appendix is 1.5 cm. There is surrounding soft tissue stranding and thickening of the adjacent peritoneal reflection. Small foci of extraluminal gas noted within the right lower quadrant of the abdomen, axial image 58/4. No focal fluid collection identified to indicate presence of drainable abscess. PERITONEUM AND RETROPERITONEUM: Small volume of complex free fluid within the pelvis with overlying suggesting peritonitis. VASCULATURE: Aorta is normal in caliber. LYMPH NODES: No lymphadenopathy. REPRODUCTIVE ORGANS: Calcified uterine fibroid identified. No adnexal mass. BONES AND SOFT TISSUES: No acute or suspicious osseous finding. IMPRESSION: 1. Acute. perforated appendicitis with surrounding soft tissue stranding, thickening of the adjacent peritoneal  reflection, and small foci of extraluminal gas in the right lower quadrant. No drainable abscess identified. 2. Multiple gallstones, largest measuring 1 cm. No gallbladder wall thickening or pericholecystic fluid. 3. Small volume of complex free fluid within the pelvis, suggestive of peritonitis. 4. Critical findings were discussed with the ordering provider, Leita Chancy, at the time of interpretation on 05/22/2024 at 05:32 am Electronically signed by: Waddell Calk MD 05/22/2024 05:32 AM EDT RP Workstation: [id number]     .Critical Care  Performed by: Chancy Leita LABOR, PA-C Authorized by: Chancy Leita LABOR, PA-C   Critical care provider statement:    Critical care time (minutes):  30   Critical care was time spent personally by me on the following activities:  Development of treatment plan with patient or surrogate, discussions with consultants, evaluation of patient's response to treatment, examination of patient, ordering and review of laboratory studies, ordering and review of radiographic studies, ordering and performing treatments and interventions, pulse oximetry, re-evaluation of patient's condition and review of old charts    Medications Ordered in the ED  lactated ringers  infusion (has no administration in time range)  levofloxacin  (LEVAQUIN ) IVPB 750 mg (has no administration in time range)  metroNIDAZOLE  (FLAGYL ) IVPB 500 mg (  has no administration in time range)  sodium chloride  0.9 % bolus 1,000 mL (has no administration in time range)  sodium chloride  0.9 % bolus 1,000 mL (1,000 mLs Intravenous New Bag/Given 05/22/24 0448)  ondansetron  (ZOFRAN ) injection 4 mg (4 mg Intravenous Given 05/22/24 0443)  morphine  (PF) 4 MG/ML injection 4 mg (4 mg Intravenous Given 05/22/24 0443)  iohexol  (OMNIPAQUE ) 350 MG/ML injection 75 mL (75 mLs Intravenous Contrast Given 05/22/24 0446)                                    Medical Decision Making Amount and/or Complexity of Data Reviewed Labs:  ordered. Radiology: ordered.  Risk Prescription drug management. Decision regarding hospitalization.   This patient presents to the ED for concern of abdominal pain, nausea, vomiting, diarrhea, this involves an extensive number of treatment options, and is a complaint that carries with it a high risk of complications and morbidity.  The differential diagnosis includes but not limited to diverticulitis, appendicitis, SBO, colitis, mass   Co morbidities / Chronic conditions that complicate the patient evaluation  Osteoarthritis   Additional history obtained:  Additional history obtained from EMR External records from outside source obtained and reviewed including Prior labs and imaging on file.  No prior abdominal scans.   Lab Tests:  I Ordered, and personally interpreted labs.  The pertinent results include: CBC with significant leukocytosis white count of 24.2.  CMP without significant findings.  Lactic acid is elevated at 2.5, will continue to trend.  Urinalysis is hazy with moderate hemoglobin, ketones, protein and rare bacteria, negative for nitrates and leukocytes.   Imaging Studies ordered:  I ordered imaging studies including CT abdomen pelvis I independently visualized and interpreted imaging which showed fluid collection in the pelvis I agree with the radiologist interpretation-call from radiology for perforated appendix    Problem List / ED Course / Critical interventions / Medication management  58 year old female presents the emergency room with progressively worsening abdominal pain with vomiting and an episode of diarrhea.  She is found have generalized abdominal tenderness, worse in the lower belly with guarding.  She is tachycardic, blood pressure of 105/71.  Repeat oral temperature is 98.9 at bedside at time of exam.  Workup completed in the lobby prior to patient being seen in the room with significant leukocytosis with a white count of 24.  Add on lactic acid is  elevated at 2.5.  Patient is sent for a CT abdomen pelvis which shows a perforated appendix.  Case was discussed with Dr. Paola with general surgery who will come and see the patient.  She was provided with IV fluids and antibiotics per sepsis protocol. I ordered medication including morphine , Zofran , IV fluids, Levaquin , Flagyl  Reevaluation of the patient after these medicines showed that the patient stable, pain improved I have reviewed the patients home medicines and have made adjustments as needed   Consultations Obtained:  I requested consultation with the Dr. Charm with general surgery,  and discussed lab and imaging findings as well as pertinent plan - they recommend: Will come and see the patient   Social Determinants of Health:  Visiting from [facility], here to take care of her grandchildren while her daughter is out of the country.   Test / Admission - Considered:  Admit      Final diagnoses:  Acute perforated appendicitis  Calculus of gallbladder without cholecystitis without obstruction  ED Discharge Orders     None          Beverley Leita LABOR, PA-C 05/22/24 [id number]    Franklyn Sid SAILOR, MD 05/23/24 [phone]

## 2024-05-22 NOTE — Progress Notes (Signed)
 Yellow MEWS due to high HR and low BP. MD is aware. Pt already received bolus of fluid, currently on IV fluid. Pt also stated that her BP usually runs low. Pt remains asymptomatic, Yellow MEWS VS has been implemented.   05/22/24 1349  Assess: MEWS Score  Temp 98.6 F (37 C)  BP (!) 94/58  MAP (mmHg) 70  Pulse Rate (!) 102  Resp 17  Level of Consciousness Alert  SpO2 98 %  O2 Device Room Air  Assess: MEWS Score  MEWS Temp 0  MEWS Systolic 1  MEWS Pulse 1  MEWS RR 0  MEWS LOC 0  MEWS Score 2  MEWS Score Color Yellow  Assess: if the MEWS score is Yellow or Red  Were vital signs accurate and taken at a resting state? Yes  Does the patient meet 2 or more of the SIRS criteria? No  MEWS guidelines implemented  Yes, yellow  Treat  MEWS Interventions Considered administering scheduled or prn medications/treatments as ordered  Take Vital Signs  Increase Vital Sign Frequency  Yellow: Q2hr x1, continue Q4hrs until patient remains green for 12hrs  Escalate  MEWS: Escalate Yellow: Discuss with charge nurse and consider notifying provider and/or RRT  Notify: Charge Nurse/RN  Name of Charge Nurse/RN Notified Radiographer, therapeutic  Assess: SIRS CRITERIA  SIRS Temperature  0  SIRS Respirations  0  SIRS Pulse 1  SIRS WBC 1  SIRS Score Sum  2

## 2024-05-23 LAB — CBC
HCT: 33 % — ABNORMAL LOW (ref 36.0–46.0)
Hemoglobin: 10.8 g/dL — ABNORMAL LOW (ref 12.0–15.0)
MCH: 31 pg (ref 26.0–34.0)
MCHC: 32.7 g/dL (ref 30.0–36.0)
MCV: 94.8 fL (ref 80.0–100.0)
Platelets: 267 K/uL (ref 150–400)
RBC: 3.48 MIL/uL — ABNORMAL LOW (ref 3.87–5.11)
RDW: 13.3 % (ref 11.5–15.5)
WBC: 21.3 K/uL — ABNORMAL HIGH (ref 4.0–10.5)
nRBC: 0 % (ref 0.0–0.2)

## 2024-05-23 LAB — URINE CULTURE: Culture: <10000 — AB

## 2024-05-23 LAB — BASIC METABOLIC PANEL WITH GFR
Anion gap: 9 (ref 5–15)
BUN: 9 mg/dL (ref 6–20)
CO2: 21 mmol/L — ABNORMAL LOW (ref 22–32)
Calcium: 8.3 mg/dL — ABNORMAL LOW (ref 8.9–10.3)
Chloride: 107 mmol/L (ref 98–111)
Creatinine, Ser: 0.83 mg/dL (ref 0.44–1.00)
GFR, Estimated: >60 mL/min (ref >60)
Glucose, Bld: 98 mg/dL (ref 70–99)
Potassium: 3.2 mmol/L — ABNORMAL LOW (ref 3.5–5.1)
Sodium: 137 mmol/L (ref 135–145)

## 2024-05-23 MED ORDER — LACTATED RINGERS IV SOLN: INTRAVENOUS | Status: AC

## 2024-05-23 MED ORDER — POTASSIUM CHLORIDE CRYS ER 20 MEQ PO TBCR
40.0000 meq | EXTENDED_RELEASE_TABLET | Freq: Three times a day (TID) | ORAL | Status: AC
  Administered 2024-05-23 (×2): 40 meq via ORAL
  Filled 2024-05-23 (×2): qty 2

## 2024-05-23 NOTE — Progress Notes (Signed)
 Progress Note     Subjective: Pt reports small BM yesterday and passing a little flatus. Nausea and vomiting improving. She still has some RLQ pain but overall it is better than on initial presentation.   Objective: Vital signs in last 24 hours: Temp:  [98.3 F (36.8 C)-100.8 F (38.2 C)] 98.3 F (36.8 C) (09/14 0808) Pulse Rate:  [102-120] 110 (09/14 0808) Resp:  [15-22] 17 (09/14 0808) BP: (91-114)/(53-83) 110/79 (09/14 0808) SpO2:  [94 %-100 %] 96 % (09/14 0808) Last BM Date : 05/23/24  Intake/Output from previous day: 09/13 0701 - 09/14 0700 In: 5098.1 [I.V.:2601.4; IV Piggyback:2496.6] Out: -  Intake/Output this shift: No intake/output data recorded.  PE: General: pleasant, WD, overweight female who is laying in bed in NAD Heart: sinus tachycardia in the low 100s  Palpable radial and pedal pulses bilaterally Lungs:  Respiratory effort nonlabored Abd: soft, generalized mild TTP with focally increased TTP in the RLQ, ND, no masses, hernias, or organomegaly MS: all 4 extremities are symmetrical with no cyanosis, clubbing, or edema. Psych: A&Ox3 with an appropriate affect.    Lab Results:  Recent Labs    05/22/24 0945 05/23/24 0623  WBC 24.7* 21.3*  HGB 12.1 10.8*  HCT 37.3 33.0*  PLT 263 267   BMET Recent Labs    05/21/24 2222 05/22/24 0945 05/23/24 0623  NA 139  --  137  K 4.0  --  3.2*  CL 101  --  107  CO2 24  --  21*  GLUCOSE 103*  --  98  BUN 7  --  9  CREATININE 0.90 0.79 0.83  CALCIUM 9.5  --  8.3*   PT/INR Recent Labs    05/22/24 0539  LABPROT 17.0*  INR 1.3*   CMP     Component Value Date/Time   NA 137 05/23/2024 0623   K 3.2 (L) 05/23/2024 0623   CL 107 05/23/2024 0623   CO2 21 (L) 05/23/2024 0623   GLUCOSE 98 05/23/2024 0623   BUN 9 05/23/2024 0623   CREATININE 0.83 05/23/2024 0623   CALCIUM 8.3 (L) 05/23/2024 0623   PROT 7.3 05/21/2024 2222   ALBUMIN 3.6 05/21/2024 2222   AST 24 05/21/2024 2222   ALT 20 05/21/2024 2222    ALKPHOS 67 05/21/2024 2222   BILITOT 1.0 05/21/2024 2222   GFRNONAA >60 05/23/2024 0623   GFRAA >60 09/07/2018 1557   Lipase     Component Value Date/Time   LIPASE 42 05/21/2024 2222       Studies/Results: DG Chest Port 1 View Result Date: 05/22/2024 EXAM: 1 VIEW XRAY OF THE CHEST 05/22/2024 05:21:05 AM COMPARISON: None available. CLINICAL HISTORY: Questionable sepsis - evaluate for abnormality FINDINGS: LUNGS AND PLEURA: No focal pulmonary opacity. No pulmonary edema. No pleural effusion. No pneumothorax. HEART AND MEDIASTINUM: No acute abnormality of the cardiac and mediastinal silhouettes. BONES AND SOFT TISSUES: No acute osseous abnormality. IMPRESSION: 1. No acute process. Electronically signed by: Waddell Calk MD 05/22/2024 05:46 AM EDT RP Workstation: [id number]   CT ABDOMEN PELVIS W CONTRAST Result Date: 05/22/2024 EXAM: CT ABDOMEN AND PELVIS WITH CONTRAST 05/22/2024 04:45:35 AM TECHNIQUE: CT of the abdomen and pelvis was performed with the administration of intravenous contrast. Multiplanar reformatted images are provided for review. Automated exposure control, iterative reconstruction, and/or weight-based adjustment of the mA/kV was utilized to reduce the radiation dose to as low as reasonably achievable. COMPARISON: None available. CLINICAL HISTORY: Abdominal pain, acute, nonlocalized. Pt threw up after contrast administration, repeated  scan after she was done. FINDINGS: LOWER CHEST: Small hiatal hernia. LIVER: The liver is unremarkable. GALLBLADDER AND BILE DUCTS: Multiple stones are identified within the gallbladder measuring up to 1 cm. No gallbladder wall thickening or pericholecystic fluid. SPLEEN: No acute abnormality. PANCREAS: Pancreas appears normal without made duct dilatation, inflammation or mass. ADRENAL GLANDS: No acute abnormality. KIDNEYS, URETERS AND BLADDER: No stones in the kidneys or ureters. No hydronephrosis. No perinephric or periureteral stranding. Urinary  bladder is unremarkable. GI AND BOWEL: Stomach is nondistended. The appendix is thickened with diffuse appendiceal wall thickening. Maximum diameter of the appendix is 1.5 cm. There is surrounding soft tissue stranding and thickening of the adjacent peritoneal reflection. Small foci of extraluminal gas noted within the right lower quadrant of the abdomen, axial image 58/4. No focal fluid collection identified to indicate presence of drainable abscess. PERITONEUM AND RETROPERITONEUM: Small volume of complex free fluid within the pelvis with overlying suggesting peritonitis. VASCULATURE: Aorta is normal in caliber. LYMPH NODES: No lymphadenopathy. REPRODUCTIVE ORGANS: Calcified uterine fibroid identified. No adnexal mass. BONES AND SOFT TISSUES: No acute or suspicious osseous finding. IMPRESSION: 1. Acute. perforated appendicitis with surrounding soft tissue stranding, thickening of the adjacent peritoneal reflection, and small foci of extraluminal gas in the right lower quadrant. No drainable abscess identified. 2. Multiple gallstones, largest measuring 1 cm. No gallbladder wall thickening or pericholecystic fluid. 3. Small volume of complex free fluid within the pelvis, suggestive of peritonitis. 4. Critical findings were discussed with the ordering provider, Leita Chancy, at the time of interpretation on 05/22/2024 at 05:32 am Electronically signed by: Waddell Calk MD 05/22/2024 05:32 AM EDT RP Workstation: [id number]    Anti-infectives: Anti-infectives (From admission, onward)    Start     Dose/Rate Route Frequency Ordered Stop   05/22/24 2000  metroNIDAZOLE  (FLAGYL ) IVPB 500 mg       Placed in And Linked Group   500 mg 100 mL/hr over 60 Minutes Intravenous Every 12 hours 05/22/24 0826 05/29/24 1959   05/22/24 0830  ceFEPIme  (MAXIPIME ) 2 g in sodium chloride  0.9 % 100 mL IVPB       Placed in And Linked Group   2 g 200 mL/hr over 30 Minutes Intravenous Every 8 hours 05/22/24 0826 05/29/24 0559    05/22/24 0515  levofloxacin  (LEVAQUIN ) IVPB 750 mg        750 mg 100 mL/hr over 90 Minutes Intravenous  Once 05/22/24 0510 05/22/24 0837   05/22/24 0515  metroNIDAZOLE  (FLAGYL ) IVPB 500 mg        500 mg 100 mL/hr over 60 Minutes Intravenous  Once 05/22/24 0510 05/22/24 0703        Assessment/Plan  Perforated appendicitis  - CT 9/13 w/ acute perforated appendicitis with surrounding soft tissue stranding, thickening of adjacent peritoneum and small foci of gas in the RLQ, no abscess, small volume of free fluid in pelvis - WBC 21K from 24K, Tmax 100.8, still mildly tachycardic - ok to have CLD today but would not advance past this - hopefully she will improve with non-operative management and could plan for interval appendectomy in a few weeks. Concerned that surgery now could potentially end up needing to be open or even ileocecectomy based on degree of inflammation on the scan - no emergent surgery planned for today  FEN: CLD, IVF to 50 cc/h VTE: LMWH, SCDs ID: [email or web address] /[email or web address]      LOS: 1 day   I reviewed last 24 h vitals and pain scores, last 48 h intake and  output, last 24 h labs and trends, and last 24 h imaging results.  This care required moderate level of medical decision making.    Burnard JONELLE Louder, [facility] 05/23/2024, 8:55 AM Please see Amion for pager number during day hours 7:00am-4:30pm

## 2024-05-23 NOTE — Plan of Care (Signed)
   Problem: Education: Goal: Knowledge of General Education information will improve Description Including pain rating scale, medication(s)/side effects and non-pharmacologic comfort measures Outcome: Progressing   Problem: Activity: Goal: Risk for activity intolerance will decrease Outcome: Progressing   Problem: Safety: Goal: Ability to remain free from injury will improve Outcome: Progressing

## 2024-05-24 LAB — BASIC METABOLIC PANEL WITH GFR
Anion gap: 11 (ref 5–15)
BUN: 6 mg/dL (ref 6–20)
CO2: 21 mmol/L — ABNORMAL LOW (ref 22–32)
Calcium: 8.5 mg/dL — ABNORMAL LOW (ref 8.9–10.3)
Chloride: 107 mmol/L (ref 98–111)
Creatinine, Ser: 0.75 mg/dL (ref 0.44–1.00)
GFR, Estimated: >60 mL/min (ref >60)
Glucose, Bld: 103 mg/dL — ABNORMAL HIGH (ref 70–99)
Potassium: 4 mmol/L (ref 3.5–5.1)
Sodium: 139 mmol/L (ref 135–145)

## 2024-05-24 LAB — CBC
HCT: 32.8 % — ABNORMAL LOW (ref 36.0–46.0)
Hemoglobin: 10.8 g/dL — ABNORMAL LOW (ref 12.0–15.0)
MCH: 31.2 pg (ref 26.0–34.0)
MCHC: 32.9 g/dL (ref 30.0–36.0)
MCV: 94.8 fL (ref 80.0–100.0)
Platelets: 283 K/uL (ref 150–400)
RBC: 3.46 MIL/uL — ABNORMAL LOW (ref 3.87–5.11)
RDW: 13.3 % (ref 11.5–15.5)
WBC: 21.8 K/uL — ABNORMAL HIGH (ref 4.0–10.5)
nRBC: 0 % (ref 0.0–0.2)

## 2024-05-24 NOTE — Plan of Care (Addendum)
 Tolerating clear liquid diet. Denies n/v.  Reports small BM. Has been ambulating, abdomen with small distention, no changes from AM Problem: Education: Goal: Knowledge of General Education information will improve Description: Including pain rating scale, medication(s)/side effects and non-pharmacologic comfort measures Outcome: Progressing   Problem: Health Behavior/Discharge Planning: Goal: Ability to manage health-related needs will improve Outcome: Progressing   Problem: Clinical Measurements: Goal: Ability to maintain clinical measurements within normal limits will improve Outcome: Progressing Goal: Will remain free from infection Outcome: Progressing Goal: Diagnostic test results will improve Outcome: Progressing Goal: Respiratory complications will improve Outcome: Progressing Goal: Cardiovascular complication will be avoided Outcome: Progressing

## 2024-05-24 NOTE — Progress Notes (Signed)
 Prn Zofran  given this morning for nausea, no emesis episode. Reports nausea has improved.  Changed from NPO to clears, tolerating diet.

## 2024-05-24 NOTE — Plan of Care (Signed)
   Problem: Education: Goal: Knowledge of General Education information will improve Description: Including pain rating scale, medication(s)/side effects and non-pharmacologic comfort measures Outcome: Progressing   Problem: Health Behavior/Discharge Planning: Goal: Ability to manage health-related needs will improve Outcome: Progressing   Problem: Activity: Goal: Risk for activity intolerance will decrease Outcome: Progressing   Problem: Pain Managment: Goal: General experience of comfort will improve and/or be controlled Outcome: Progressing   Problem: Safety: Goal: Ability to remain free from injury will improve Outcome: Progressing

## 2024-05-24 NOTE — Progress Notes (Signed)
   05/24/24 1301  TOC Brief Assessment  Insurance and Status Reviewed  Patient has primary care physician Yes  Home environment has been reviewed spouse  Prior level of function: independent  Prior/Current Home Services No current home services  Social Drivers of Health Review SDOH reviewed no interventions necessary  Transition of care needs no transition of care needs at this time     Transition of Care Department [facility]) has reviewed patient and no TOC needs have been identified at this time. We will continue to monitor patient advancement through interdisciplinary progression rounds. If new patient transition needs arise, please place a TOC consult.

## 2024-05-24 NOTE — Progress Notes (Signed)
 Progress Note     Subjective: Patient reports continued pain of the abdomen that is more prominent of right lower abdomen. Patient tolerated CLD yesterday. Has been NPO since midnight. Having nausea without vomiting. Reports small BM yesterday. None today yet. Reports flatulence.  ROS  All negative with the exception of above.  Objective: Vital signs in last 24 hours: Temp:  [99 F (37.2 C)-99.4 F (37.4 C)] 99 F (37.2 C) (09/15 0400) Pulse Rate:  [106-116] 110 (09/15 0400) Resp:  [16-20] 18 (09/15 0400) BP: (107-115)/(54-73) 115/73 (09/15 0400) SpO2:  [95 %-99 %] 99 % (09/15 0400) Last BM Date : 05/23/24  Intake/Output from previous day: 09/14 0701 - 09/15 0700 In: 859.5 [P.O.:360; I.V.:199.5; IV Piggyback:300] Out: -  Intake/Output this shift: No intake/output data recorded.  PE: General: Pleasant female who is sitting at the edge of the bed. HEENT: Head is normocephalic, atraumatic.   Heart: Sinus tachycardia (110s).  Lungs: Respiratory effort nonlabored Abd: Soft, ND. Generalized tenderness that is more prominent of the RLQ. No guarding or rebound tenderness. MS: all 4 extremities are symmetrical with no cyanosis, clubbing, or edema. Skin: Warm and dry Psych: A&Ox3 with an appropriate affect.    Lab Results:  Recent Labs    05/23/24 0623 05/24/24 0334  WBC 21.3* 21.8*  HGB 10.8* 10.8*  HCT 33.0* 32.8*  PLT 267 283   BMET Recent Labs    05/23/24 0623 05/24/24 0334  NA 137 139  K 3.2* 4.0  CL 107 107  CO2 21* 21*  GLUCOSE 98 103*  BUN 9 6  CREATININE 0.83 0.75  CALCIUM 8.3* 8.5*   PT/INR Recent Labs    05/22/24 0539  LABPROT 17.0*  INR 1.3*   CMP     Component Value Date/Time   NA 139 05/24/2024 0334   K 4.0 05/24/2024 0334   CL 107 05/24/2024 0334   CO2 21 (L) 05/24/2024 0334   GLUCOSE 103 (H) 05/24/2024 0334   BUN 6 05/24/2024 0334   CREATININE 0.75 05/24/2024 0334   CALCIUM 8.5 (L) 05/24/2024 0334   PROT 7.3 05/21/2024 2222    ALBUMIN 3.6 05/21/2024 2222   AST 24 05/21/2024 2222   ALT 20 05/21/2024 2222   ALKPHOS 67 05/21/2024 2222   BILITOT 1.0 05/21/2024 2222   GFRNONAA >60 05/24/2024 0334   GFRAA >60 09/07/2018 1557   Lipase     Component Value Date/Time   LIPASE 42 05/21/2024 2222       Studies/Results: No results found.  Anti-infectives: Anti-infectives (From admission, onward)    Start     Dose/Rate Route Frequency Ordered Stop   05/22/24 2000  metroNIDAZOLE  (FLAGYL ) IVPB 500 mg       Placed in And Linked Group   500 mg 100 mL/hr over 60 Minutes Intravenous Every 12 hours 05/22/24 0826 05/29/24 1959   05/22/24 0830  ceFEPIme  (MAXIPIME ) 2 g in sodium chloride  0.9 % 100 mL IVPB       Placed in And Linked Group   2 g 200 mL/hr over 30 Minutes Intravenous Every 8 hours 05/22/24 0826 05/29/24 0559   05/22/24 0515  levofloxacin  (LEVAQUIN ) IVPB 750 mg        750 mg 100 mL/hr over 90 Minutes Intravenous  Once 05/22/24 0510 05/22/24 0837   05/22/24 0515  metroNIDAZOLE  (FLAGYL ) IVPB 500 mg        500 mg 100 mL/hr over 60 Minutes Intravenous  Once 05/22/24 0510 05/22/24 0703  Assessment/Plan Perforated appendicitis  - CT 9/13 w/ acute perforated appendicitis with surrounding soft tissue stranding, thickening of adjacent peritoneum and small foci of gas in the RLQ, no abscess, small volume of free fluid in pelvis - WBC 21.8 from 21.3, Tmax 99 since 9/14, still tachycardic 110-111. - Will place back on CLD today but would not advance past this.  - Will continue with non-operative management at this time and hopefully be able to plan for interval appendectomy in a few weeks. Continue to feel that that surgery now could potentially end up needing to be open or even ileocecectomy based on degree of inflammation on the scan. May repeat CT 9/16 or 9/17 if patient not greatly improved.     FEN: CLD; LR at 50 mL/hr VTE: LMWH, SCDs ID: [email or web address]      LOS: 2 days   I reviewed  specialist notes, nursing notes, last 24 h vitals and pain scores, last 48 h intake and output, last 24 h labs and trends, and last 24 h imaging results.  This care required moderate level of medical decision making.    Marjorie Carlyon Favre, [facility] Surgery 05/24/2024, 8:47 AM Please see Amion for pager number during day hours 7:00am-4:30pm

## 2024-05-25 ENCOUNTER — Encounter (HOSPITAL_COMMUNITY): Payer: Self-pay

## 2024-05-25 ENCOUNTER — Inpatient Hospital Stay (HOSPITAL_COMMUNITY)

## 2024-05-25 LAB — CBC
HCT: 30.5 % — ABNORMAL LOW (ref 36.0–46.0)
Hemoglobin: 10.3 g/dL — ABNORMAL LOW (ref 12.0–15.0)
MCH: 31.7 pg (ref 26.0–34.0)
MCHC: 33.8 g/dL (ref 30.0–36.0)
MCV: 93.8 fL (ref 80.0–100.0)
Platelets: 292 K/uL (ref 150–400)
RBC: 3.25 MIL/uL — ABNORMAL LOW (ref 3.87–5.11)
RDW: 13.4 % (ref 11.5–15.5)
WBC: 20.2 K/uL — ABNORMAL HIGH (ref 4.0–10.5)
nRBC: 0 % (ref 0.0–0.2)

## 2024-05-25 LAB — BASIC METABOLIC PANEL WITH GFR
Anion gap: 12 (ref 5–15)
BUN: 6 mg/dL (ref 6–20)
CO2: 22 mmol/L (ref 22–32)
Calcium: 8.2 mg/dL — ABNORMAL LOW (ref 8.9–10.3)
Chloride: 103 mmol/L (ref 98–111)
Creatinine, Ser: 0.67 mg/dL (ref 0.44–1.00)
GFR, Estimated: >60 mL/min (ref >60)
Glucose, Bld: 90 mg/dL (ref 70–99)
Potassium: 3.6 mmol/L (ref 3.5–5.1)
Sodium: 137 mmol/L (ref 135–145)

## 2024-05-25 MED ORDER — IOHEXOL 350 MG/ML SOLN
75.0000 mL | Freq: Once | INTRAVENOUS | Status: AC | PRN
  Administered 2024-05-25: 75 mL via INTRAVENOUS

## 2024-05-25 NOTE — Plan of Care (Signed)

## 2024-05-25 NOTE — Plan of Care (Signed)
  Problem: Activity: Goal: Risk for activity intolerance will decrease Outcome: Progressing   Problem: Nutrition: Goal: Adequate nutrition will be maintained Outcome: Progressing   Problem: Coping: Goal: Level of anxiety will decrease Outcome: Progressing   Problem: Pain Managment: Goal: General experience of comfort will improve and/or be controlled Outcome: Progressing   Problem: Safety: Goal: Ability to remain free from injury will improve Outcome: Progressing

## 2024-05-25 NOTE — Progress Notes (Signed)
 Progress Note     Subjective: Patient continues to have abdominal pain that is more prominent of the right lower quadrant. Reports that pain medication is helping. Tolerated CLD yesterday. Had some nausea yesterday afternoon. Denies BM, flatulence, and vomiting.   ROS  All negative with the exception of above.   Objective: Vital signs in last 24 hours: Temp:  [98.4 F (36.9 C)-99.9 F (37.7 C)] 99.8 F (37.7 C) (09/16 0801) Pulse Rate:  [99-103] 100 (09/16 0801) Resp:  [16-17] 17 (09/16 0801) BP: (105-123)/(66-80) 116/73 (09/16 0801) SpO2:  [96 %-99 %] 96 % (09/16 0801) Last BM Date : 05/24/24  Intake/Output from previous day: 09/15 0701 - 09/16 0700 In: 960 [P.O.:960] Out: -  Intake/Output this shift: No intake/output data recorded.  PE: General: Pleasant female who is sitting at the edge of the bed. HEENT: Head is normocephalic, atraumatic.   Heart: Sinus tachycardia (103).  Lungs: Respiratory effort nonlabored Abd: Soft, ND. Generalized tenderness that is more prominent of the RLQ. No guarding or rebound tenderness. MS: All 4 extremities are symmetrical with no cyanosis, clubbing, or edema. Skin: Warm and dry Psych: A&Ox3 with an appropriate affect.    Lab Results:  Recent Labs    05/23/24 0623 05/24/24 0334  WBC 21.3* 21.8*  HGB 10.8* 10.8*  HCT 33.0* 32.8*  PLT 267 283   BMET Recent Labs    05/23/24 0623 05/24/24 0334  NA 137 139  K 3.2* 4.0  CL 107 107  CO2 21* 21*  GLUCOSE 98 103*  BUN 9 6  CREATININE 0.83 0.75  CALCIUM 8.3* 8.5*   PT/INR No results for input(s): LABPROT, INR in the last 72 hours. CMP     Component Value Date/Time   NA 139 05/24/2024 0334   K 4.0 05/24/2024 0334   CL 107 05/24/2024 0334   CO2 21 (L) 05/24/2024 0334   GLUCOSE 103 (H) 05/24/2024 0334   BUN 6 05/24/2024 0334   CREATININE 0.75 05/24/2024 0334   CALCIUM 8.5 (L) 05/24/2024 0334   PROT 7.3 05/21/2024 2222   ALBUMIN 3.6 05/21/2024 2222   AST 24  05/21/2024 2222   ALT 20 05/21/2024 2222   ALKPHOS 67 05/21/2024 2222   BILITOT 1.0 05/21/2024 2222   GFRNONAA >60 05/24/2024 0334   GFRAA >60 09/07/2018 1557   Lipase     Component Value Date/Time   LIPASE 42 05/21/2024 2222       Studies/Results: No results found.  Anti-infectives: Anti-infectives (From admission, onward)    Start     Dose/Rate Route Frequency Ordered Stop   05/22/24 2000  metroNIDAZOLE  (FLAGYL ) IVPB 500 mg       Placed in And Linked Group   500 mg 100 mL/hr over 60 Minutes Intravenous Every 12 hours 05/22/24 0826 05/29/24 1959   05/22/24 0830  ceFEPIme  (MAXIPIME ) 2 g in sodium chloride  0.9 % 100 mL IVPB       Placed in And Linked Group   2 g 200 mL/hr over 30 Minutes Intravenous Every 8 hours 05/22/24 0826 05/29/24 0559   05/22/24 0515  levofloxacin  (LEVAQUIN ) IVPB 750 mg        750 mg 100 mL/hr over 90 Minutes Intravenous  Once 05/22/24 0510 05/22/24 0837   05/22/24 0515  metroNIDAZOLE  (FLAGYL ) IVPB 500 mg        500 mg 100 mL/hr over 60 Minutes Intravenous  Once 05/22/24 0510 05/22/24 0703        Assessment/Plan Perforated appendicitis  -  CT 9/13 w/ acute perforated appendicitis with surrounding soft tissue stranding, thickening of adjacent peritoneum and small foci of gas in the RLQ, no abscess, small volume of free fluid in pelvis - WBC 21.8 from 21.3 on 9/15; Labs today are pending  - Tmax 99 since 9/14, still mildly tachycardic 103. - Will continue abx. - Will repeat CT today. Have made NPO until results are received.      FEN: NPO VTE: LMWH, SCDs ID: [email or web address]      LOS: 3 days   I reviewed specialist notes, nursing notes, last 24 h vitals and pain scores, last 48 h intake and output, last 24 h labs and trends, and last 24 h imaging results.  This care required moderate level of medical decision making.    Marjorie Carlyon Favre, [facility] Surgery 05/25/2024, 8:30 AM Please see Amion for pager number during  day hours 7:00am-4:30pm

## 2024-05-26 ENCOUNTER — Inpatient Hospital Stay (HOSPITAL_COMMUNITY)

## 2024-05-26 LAB — BASIC METABOLIC PANEL WITH GFR
Anion gap: 11 (ref 5–15)
BUN: 5 mg/dL — ABNORMAL LOW (ref 6–20)
CO2: 24 mmol/L (ref 22–32)
Calcium: 8.2 mg/dL — ABNORMAL LOW (ref 8.9–10.3)
Chloride: 101 mmol/L (ref 98–111)
Creatinine, Ser: 0.68 mg/dL (ref 0.44–1.00)
GFR, Estimated: >60 mL/min (ref >60)
Glucose, Bld: 91 mg/dL (ref 70–99)
Potassium: 3.3 mmol/L — ABNORMAL LOW (ref 3.5–5.1)
Sodium: 136 mmol/L (ref 135–145)

## 2024-05-26 LAB — CBC
HCT: 29.9 % — ABNORMAL LOW (ref 36.0–46.0)
Hemoglobin: 9.8 g/dL — ABNORMAL LOW (ref 12.0–15.0)
MCH: 30.8 pg (ref 26.0–34.0)
MCHC: 32.8 g/dL (ref 30.0–36.0)
MCV: 94 fL (ref 80.0–100.0)
Platelets: 312 K/uL (ref 150–400)
RBC: 3.18 MIL/uL — ABNORMAL LOW (ref 3.87–5.11)
RDW: 13.4 % (ref 11.5–15.5)
WBC: 19.5 K/uL — ABNORMAL HIGH (ref 4.0–10.5)
nRBC: 0 % (ref 0.0–0.2)

## 2024-05-26 MED ORDER — FENTANYL CITRATE (PF) 100 MCG/2ML IJ SOLN
INTRAMUSCULAR | Status: AC | PRN
  Administered 2024-05-26 (×2): 25 ug via INTRAVENOUS
  Administered 2024-05-26: 50 ug via INTRAVENOUS

## 2024-05-26 MED ORDER — MIDAZOLAM HCL 2 MG/2ML IJ SOLN
INTRAMUSCULAR | Status: AC | PRN
  Administered 2024-05-26: .5 mg via INTRAVENOUS
  Administered 2024-05-26: 1 mg via INTRAVENOUS
  Administered 2024-05-26: .5 mg via INTRAVENOUS

## 2024-05-26 MED ORDER — SODIUM CHLORIDE 0.9% FLUSH
5.0000 mL | Freq: Three times a day (TID) | INTRAVENOUS | Status: DC
  Administered 2024-05-26 – 2024-05-30 (×11): 5 mL

## 2024-05-26 MED ORDER — LIDOCAINE HCL 1 % IJ SOLN
10.0000 mL | Freq: Once | INTRAMUSCULAR | Status: AC
Start: 2024-05-26 — End: 2024-05-26
  Administered 2024-05-26: 10 mL via INTRADERMAL

## 2024-05-26 MED ORDER — LIDOCAINE 1 % OPTIME INJ - NO CHARGE
10.0000 mL | Freq: Once | INTRAMUSCULAR | Status: AC
  Administered 2024-05-26: 10 mL
  Filled 2024-05-26: qty 10

## 2024-05-26 MED ORDER — MIDAZOLAM HCL 2 MG/2ML IJ SOLN
INTRAMUSCULAR | Status: AC
  Filled 2024-05-26: qty 2

## 2024-05-26 MED ORDER — FENTANYL CITRATE (PF) 100 MCG/2ML IJ SOLN
INTRAMUSCULAR | Status: AC
  Filled 2024-05-26: qty 2

## 2024-05-26 NOTE — Procedures (Signed)
 Vascular and Interventional Radiology Procedure Note  Patient: Melissa Wu DOB: September 27, 1965 Medical Record Number: [id number] Note Date/Time: 05/26/24 11:08 AM   Performing Physician: Thom Hall, MD Assistant(s): None  Diagnosis: Pelvic abscess. Hx perforated appendicitis  Procedure: DRAINAGE CATHETER PLACEMENT into a PELVIC ABSCESS  Anesthesia: Conscious Sedation Complications: None Estimated Blood Loss: Minimal Specimens: Sent for Gram Stain, Aerobe Culture, and Anerobe Culture  Findings:  Successful CT-guided placement of 10 F catheter into pelvic abscess via R TG approach.  Plan:  - Flush drain with 5 mL Normal Saline every 8 hours. - Follow up drain evaluation / sinogram in 7 day(s).  See detailed procedure note with images in PACS. The patient tolerated the procedure well without incident or complication and was returned to Recovery in stable condition.    Thom Hall, MD [facility] Radiology   Pager. [phone] Clinic. [phone]

## 2024-05-26 NOTE — Progress Notes (Signed)
 Progress Note     Subjective: Patient reports that pain is manageable. Still present mostly of the RLQ. Had a bowel movement and flatulence. Some nausea. Denies vomiting.  ROS  All negative with the exception of above.  Objective: Vital signs in last 24 hours: Temp:  [98.3 F (36.8 C)-99.8 F (37.7 C)] 98.7 F (37.1 C) (09/17 0812) Pulse Rate:  [85-93] 85 (09/17 0812) Resp:  [18] 18 (09/17 0812) BP: (109-140)/(65-122) 119/73 (09/17 0812) SpO2:  [97 %-100 %] 99 % (09/17 0812) Last BM Date : 05/25/24  Intake/Output from previous day: No intake/output data recorded. Intake/Output this shift: No intake/output data recorded.  PE: General: Pleasant female who is sitting at the edge of the bed. HEENT: Head is normocephalic, atraumatic.   Heart: HR normal Lungs: Respiratory effort nonlabored Abd: Soft, ND. Generalized tenderness that is more prominent of the RLQ. No guarding or rebound tenderness. MS: All 4 extremities are symmetrical with no cyanosis, clubbing, or edema. Skin: Warm and dry Psych: A&Ox3 with an appropriate affect.    Lab Results:  Recent Labs    05/24/24 0334 05/25/24 0903  WBC 21.8* 20.2*  HGB 10.8* 10.3*  HCT 32.8* 30.5*  PLT 283 292   BMET Recent Labs    05/24/24 0334 05/25/24 0903  NA 139 137  K 4.0 3.6  CL 107 103  CO2 21* 22  GLUCOSE 103* 90  BUN 6 6  CREATININE 0.75 0.67  CALCIUM 8.5* 8.2*   PT/INR No results for input(s): LABPROT, INR in the last 72 hours. CMP     Component Value Date/Time   NA 137 05/25/2024 0903   K 3.6 05/25/2024 0903   CL 103 05/25/2024 0903   CO2 22 05/25/2024 0903   GLUCOSE 90 05/25/2024 0903   BUN 6 05/25/2024 0903   CREATININE 0.67 05/25/2024 0903   CALCIUM 8.2 (L) 05/25/2024 0903   PROT 7.3 05/21/2024 2222   ALBUMIN 3.6 05/21/2024 2222   AST 24 05/21/2024 2222   ALT 20 05/21/2024 2222   ALKPHOS 67 05/21/2024 2222   BILITOT 1.0 05/21/2024 2222   GFRNONAA >60 05/25/2024 0903   GFRAA >60  09/07/2018 1557   Lipase     Component Value Date/Time   LIPASE 42 05/21/2024 2222       Studies/Results: CT ABDOMEN PELVIS W CONTRAST Result Date: 05/25/2024 CLINICAL DATA:  Perforated appendicitis EXAM: CT ABDOMEN AND PELVIS WITH CONTRAST TECHNIQUE: Multidetector CT imaging of the abdomen and pelvis was performed using the standard protocol following bolus administration of intravenous contrast. RADIATION DOSE REDUCTION: This exam was performed according to the departmental dose-optimization program which includes automated exposure control, adjustment of the mA and/or kV according to patient size and/or use of iterative reconstruction technique. CONTRAST:  75mL OMNIPAQUE  IOHEXOL  350 MG/ML SOLN COMPARISON:  05/22/2024 FINDINGS: Lower chest: Small right pleural effusion, new. Dependent atelectasis in the right lower lobe. Hepatobiliary: Numerous gallstones within the gallbladder. No evidence of acute cholecystitis. No focal hepatic abnormality or biliary ductal dilatation. Pancreas: No focal abnormality or ductal dilatation. Spleen: No focal abnormality.  Normal size. Adrenals/Urinary Tract: No adrenal abnormality. No focal renal abnormality. No stones or hydronephrosis. Urinary bladder is unremarkable. Stomach/Bowel: Appendix remains dilated and inflamed with surrounding stranding and extraluminal air. Overall appearance is similar prior study. Small air and fluid collection forming in the anterior pelvis measuring 2.9 x 1.8 cm. Stomach, large and small bowel grossly unremarkable. No obstruction. Vascular/Lymphatic: No evidence of aneurysm or adenopathy. Reproductive: Calcified fibroid in  the fundus of the uterus. No adnexal mass. Other: Fluid in the cul-de-sac with adjacent peritoneal enhancement suggesting peritonitis or developing abscess measuring 5.3 x 3.2 cm. Musculoskeletal: No acute bony abnormality. IMPRESSION: Continued evidence of perforated appendicitis with dilated, inflamed appendix with  surrounding right lower quadrant inflammation and extraluminal air. Small developing gas and fluid collection in the anterior pelvis measures 2.9 x 1.8 cm. Rounded fluid collection in the cul-de-sac with rim enhancement could reflect free fluid and peritonitis or developing abscess. Cholelithiasis. New small right pleural effusion with dependent atelectasis in the right lower lobe. Electronically Signed   By: Franky Crease M.D.   On: 05/25/2024 13:02    Anti-infectives: Anti-infectives (From admission, onward)    Start     Dose/Rate Route Frequency Ordered Stop   05/22/24 2000  metroNIDAZOLE  (FLAGYL ) IVPB 500 mg       Placed in And Linked Group   500 mg 100 mL/hr over 60 Minutes Intravenous Every 12 hours 05/22/24 0826 05/29/24 1959   05/22/24 0830  ceFEPIme  (MAXIPIME ) 2 g in sodium chloride  0.9 % 100 mL IVPB       Placed in And Linked Group   2 g 200 mL/hr over 30 Minutes Intravenous Every 8 hours 05/22/24 0826 05/29/24 0559   05/22/24 0515  levofloxacin  (LEVAQUIN ) IVPB 750 mg        750 mg 100 mL/hr over 90 Minutes Intravenous  Once 05/22/24 0510 05/22/24 0837   05/22/24 0515  metroNIDAZOLE  (FLAGYL ) IVPB 500 mg        500 mg 100 mL/hr over 60 Minutes Intravenous  Once 05/22/24 0510 05/22/24 0703        Assessment/Plan Perforated appendicitis  - CT 9/13 w/ acute perforated appendicitis with surrounding soft tissue stranding, thickening of adjacent peritoneum and small foci of gas in the RLQ, no abscess, small volume of free fluid in pelvis  - Repeat CT from 9/16 showed continued evidence of perforated appendicitis with dilated, inflamed appendix with surrounding right lower quadrant inflammation and extraluminal air. Small developing gas and fluid collection in the anterior pelvis measures 2.9 x 1.8 cm. Rounded fluid collection in the cul-de-sac with rim enhancement could reflect free fluid and peritonitis or developing abscess. - IR has been consulted. They are planning aspiration  and possible drain placement for the posterior pelvic cul de sac fluid collection. Lovenox  has been held and patient is NPO. - WBC 20.2 from 21.8 on 9/16; Labs today are pending. - Tmax 99. No longer tachycardic. - Will continue abx. - Will continue to follow      FEN: NPO VTE: LMWH held for above, SCDs ID: [email or web address]      LOS: 4 days   I reviewed specialist notes, nursing notes, last 24 h vitals and pain scores, last 48 h intake and output, last 24 h labs and trends, and last 24 h imaging results.  This care required moderate level of medical decision making.    Marjorie Carlyon Favre, [facility] Surgery 05/26/2024, 8:13 AM Please see Amion for pager number during day hours 7:00am-4:30pm

## 2024-05-26 NOTE — Consult Note (Signed)
 Chief Complaint:  Perforated appendicitis with pelvic abscess  Procedure: Percutaneous Aspiration with possible drain placement  Referring Provider(s): Marjorie Favre, PA-C  Supervising Physician: Hughes Simmonds  Patient Status: [facility] - In-pt  History of Present Illness: Melissa Wu is a 58 y.o. female who presented to the ED on 9/13 with complaints of abdominal pain and associated vomiting. ED workup significant for leukocytosis to 24.7, lactic of 2.5, and imaging concerning for acute, perforated appendicitis. Surgery was consulted and discussed surgical intervention vs conservative management with antibiotics. Patient elected for antibiotics and surgery continued to follow. Unfortunately patient continues to be symptomatic with an elevated WBC of 20.2, even with antibiotics Repeat CT A/P on 9/16 with small developing gas and fluid collection in the anterior pelvis measuring 2.9x1.8cm. IR consulted for aspiration vs possible drain placement. Case and images reviewed and approved by Dr. JINNY Hughes.  Patient resting in bed on arrival to room. States that she has been doing well over the past few days. Admits to some continued dull, achy pain in her lower abdomen, but reports this has continued to improve since admission. Also with intermittent nausea. Denies any fevers/chills, vomiting, palpitations, chest pain, or shortness of breath. NPO since midnight. All questions and concerns answered at the bedside.   Patient is Full Code  Past Medical History:  Diagnosis Date   Osteoarthritis     History reviewed. No pertinent surgical history.  Allergies: Penicillins  Medications: Prior to Admission medications   Not on File     History reviewed. No pertinent family history.  Social History   Socioeconomic History   Marital status: Married    Spouse name: Not on file   Number of children: Not on file   Years of education: Not on file   Highest education level: Not on file   Occupational History   Not on file  Tobacco Use   Smoking status: Never   Smokeless tobacco: Never  Vaping Use   Vaping status: Never Used  Substance and Sexual Activity   Alcohol use: Yes   Drug use: Never   Sexual activity: Yes    Birth control/protection: None  Other Topics Concern   Not on file  Social History Narrative   Not on file   Social Drivers of Health   Financial Resource Strain: Not on file  Food Insecurity: No Food Insecurity (05/22/2024)   Hunger Vital Sign    Worried About Running Out of Food in the Last Year: Never true    Ran Out of Food in the Last Year: Never true  Transportation Needs: No Transportation Needs (05/22/2024)   PRAPARE - Administrator, Civil Service (Medical): No    Lack of Transportation (Non-Medical): No  Physical Activity: Not on file  Stress: Not on file  Social Connections: Not on file    Review of Systems  Constitutional:  Negative for chills and fever.  Gastrointestinal:  Positive for abdominal pain and nausea (minimal).  Patient denies any headache, chest pain, shortness of breath, vomiting, or fever/chills. All other systems are negative.   Vital Signs: BP 119/73 (BP Location: Left Arm)   Pulse 85   Temp 98.7 F (37.1 C) (Oral)   Resp 18   Ht 5' 5 (1.651 m)   Wt 172 lb (78 kg)   SpO2 99%   BMI 28.62 kg/m    Physical Exam Constitutional:      Appearance: She is well-developed.  HENT:     Mouth/Throat:  Mouth: Mucous membranes are moist.     Pharynx: Oropharynx is clear.  Cardiovascular:     Rate and Rhythm: Normal rate and regular rhythm.     Heart sounds: Normal heart sounds.  Pulmonary:     Effort: Pulmonary effort is normal.     Breath sounds: Normal breath sounds.  Abdominal:     General: Abdomen is flat.     Palpations: Abdomen is soft.     Tenderness: There is abdominal tenderness (RLQ and suprapubic).  Skin:    General: Skin is warm.  Neurological:     Mental Status: She is alert  and oriented to person, place, and time.  Psychiatric:        Behavior: Behavior normal.     Imaging: CT ABDOMEN PELVIS W CONTRAST Result Date: 05/25/2024 CLINICAL DATA:  Perforated appendicitis EXAM: CT ABDOMEN AND PELVIS WITH CONTRAST TECHNIQUE: Multidetector CT imaging of the abdomen and pelvis was performed using the standard protocol following bolus administration of intravenous contrast. RADIATION DOSE REDUCTION: This exam was performed according to the departmental dose-optimization program which includes automated exposure control, adjustment of the mA and/or kV according to patient size and/or use of iterative reconstruction technique. CONTRAST:  75mL OMNIPAQUE  IOHEXOL  350 MG/ML SOLN COMPARISON:  05/22/2024 FINDINGS: Lower chest: Small right pleural effusion, new. Dependent atelectasis in the right lower lobe. Hepatobiliary: Numerous gallstones within the gallbladder. No evidence of acute cholecystitis. No focal hepatic abnormality or biliary ductal dilatation. Pancreas: No focal abnormality or ductal dilatation. Spleen: No focal abnormality.  Normal size. Adrenals/Urinary Tract: No adrenal abnormality. No focal renal abnormality. No stones or hydronephrosis. Urinary bladder is unremarkable. Stomach/Bowel: Appendix remains dilated and inflamed with surrounding stranding and extraluminal air. Overall appearance is similar prior study. Small air and fluid collection forming in the anterior pelvis measuring 2.9 x 1.8 cm. Stomach, large and small bowel grossly unremarkable. No obstruction. Vascular/Lymphatic: No evidence of aneurysm or adenopathy. Reproductive: Calcified fibroid in the fundus of the uterus. No adnexal mass. Other: Fluid in the cul-de-sac with adjacent peritoneal enhancement suggesting peritonitis or developing abscess measuring 5.3 x 3.2 cm. Musculoskeletal: No acute bony abnormality. IMPRESSION: Continued evidence of perforated appendicitis with dilated, inflamed appendix with  surrounding right lower quadrant inflammation and extraluminal air. Small developing gas and fluid collection in the anterior pelvis measures 2.9 x 1.8 cm. Rounded fluid collection in the cul-de-sac with rim enhancement could reflect free fluid and peritonitis or developing abscess. Cholelithiasis. New small right pleural effusion with dependent atelectasis in the right lower lobe. Electronically Signed   By: Franky Crease M.D.   On: 05/25/2024 13:02   DG Chest Port 1 View Result Date: 05/22/2024 EXAM: 1 VIEW XRAY OF THE CHEST 05/22/2024 05:21:05 AM COMPARISON: None available. CLINICAL HISTORY: Questionable sepsis - evaluate for abnormality FINDINGS: LUNGS AND PLEURA: No focal pulmonary opacity. No pulmonary edema. No pleural effusion. No pneumothorax. HEART AND MEDIASTINUM: No acute abnormality of the cardiac and mediastinal silhouettes. BONES AND SOFT TISSUES: No acute osseous abnormality. IMPRESSION: 1. No acute process. Electronically signed by: Waddell Calk MD 05/22/2024 05:46 AM EDT RP Workstation: [id number]   CT ABDOMEN PELVIS W CONTRAST Result Date: 05/22/2024 EXAM: CT ABDOMEN AND PELVIS WITH CONTRAST 05/22/2024 04:45:35 AM TECHNIQUE: CT of the abdomen and pelvis was performed with the administration of intravenous contrast. Multiplanar reformatted images are provided for review. Automated exposure control, iterative reconstruction, and/or weight-based adjustment of the mA/kV was utilized to reduce the radiation dose to as low as reasonably achievable. COMPARISON:  None available. CLINICAL HISTORY: Abdominal pain, acute, nonlocalized. Pt threw up after contrast administration, repeated scan after she was done. FINDINGS: LOWER CHEST: Small hiatal hernia. LIVER: The liver is unremarkable. GALLBLADDER AND BILE DUCTS: Multiple stones are identified within the gallbladder measuring up to 1 cm. No gallbladder wall thickening or pericholecystic fluid. SPLEEN: No acute abnormality. PANCREAS: Pancreas appears  normal without made duct dilatation, inflammation or mass. ADRENAL GLANDS: No acute abnormality. KIDNEYS, URETERS AND BLADDER: No stones in the kidneys or ureters. No hydronephrosis. No perinephric or periureteral stranding. Urinary bladder is unremarkable. GI AND BOWEL: Stomach is nondistended. The appendix is thickened with diffuse appendiceal wall thickening. Maximum diameter of the appendix is 1.5 cm. There is surrounding soft tissue stranding and thickening of the adjacent peritoneal reflection. Small foci of extraluminal gas noted within the right lower quadrant of the abdomen, axial image 58/4. No focal fluid collection identified to indicate presence of drainable abscess. PERITONEUM AND RETROPERITONEUM: Small volume of complex free fluid within the pelvis with overlying suggesting peritonitis. VASCULATURE: Aorta is normal in caliber. LYMPH NODES: No lymphadenopathy. REPRODUCTIVE ORGANS: Calcified uterine fibroid identified. No adnexal mass. BONES AND SOFT TISSUES: No acute or suspicious osseous finding. IMPRESSION: 1. Acute. perforated appendicitis with surrounding soft tissue stranding, thickening of the adjacent peritoneal reflection, and small foci of extraluminal gas in the right lower quadrant. No drainable abscess identified. 2. Multiple gallstones, largest measuring 1 cm. No gallbladder wall thickening or pericholecystic fluid. 3. Small volume of complex free fluid within the pelvis, suggestive of peritonitis. 4. Critical findings were discussed with the ordering provider, Leita Chancy, at the time of interpretation on 05/22/2024 at 05:32 am Electronically signed by: Waddell Calk MD 05/22/2024 05:32 AM EDT RP Workstation: [id number]    Labs:  CBC: Recent Labs    05/22/24 0945 05/23/24 9376 05/24/24 0334 05/25/24 0903  WBC 24.7* 21.3* 21.8* 20.2*  HGB 12.1 10.8* 10.8* 10.3*  HCT 37.3 33.0* 32.8* 30.5*  PLT 263 267 283 292    COAGS: Recent Labs    05/22/24 0539  INR 1.3*     BMP: Recent Labs    05/21/24 2222 05/22/24 0945 05/23/24 0623 05/24/24 0334 05/25/24 0903  NA 139  --  137 139 137  K 4.0  --  3.2* 4.0 3.6  CL 101  --  107 107 103  CO2 24  --  21* 21* 22  GLUCOSE 103*  --  98 103* 90  BUN 7  --  9 6 6   CALCIUM 9.5  --  8.3* 8.5* 8.2*  CREATININE 0.90 0.79 0.83 0.75 0.67  GFRNONAA >60 >60 >60 >60 >60    LIVER FUNCTION TESTS: Recent Labs    05/21/24 2222  BILITOT 1.0  AST 24  ALT 20  ALKPHOS 67  PROT 7.3  ALBUMIN 3.6    TUMOR MARKERS: No results for input(s): AFPTM, CEA, CA199, CHROMGRNA in the last 8760 hours.  Assessment and Plan:  Perforated appendicitis w/ abscess: Estefania Oluchi Pucci is a 58 y.o. female with a history of perforated appendicitis who elected for conservative management with antibiotics. Unfortunately, patient has failed to improve and now has small pelvic abscess. IR consulted for aspiration vs possible drain placement. Case and imaging reviewed and approved by Dr. JINNY Hall. Procedure to be done under moderated sedation.   -NPO since midnight -INR 1.3; CBC 20.2 -Reports somewhat improved abdominal pain . -Plan for possible drain placement on 9/17 if schedule allows  Risks and benefits discussed with the  patient including bleeding, infection, damage to adjacent structures, bowel perforation/fistula connection, and sepsis.  All of the patient's questions were answered, patient is agreeable to proceed. Consent signed and in IR.   Thank you for allowing our service to participate in Dala Thereasa Iannello 's care.    Electronically Signed: Glennon CHRISTELLA Bal, PA-C   05/26/2024, 8:16 AM     I spent a total of 40 Minutes in face to face in clinical consultation, greater than 50% of which was counseling/coordinating care for pelvic abscess drain placement.

## 2024-05-26 NOTE — Plan of Care (Signed)
   Problem: Education: Goal: Knowledge of General Education information will improve Description: Including pain rating scale, medication(s)/side effects and non-pharmacologic comfort measures Outcome: Progressing   Problem: Clinical Measurements: Goal: Ability to maintain clinical measurements within normal limits will improve Outcome: Progressing Goal: Diagnostic test results will improve Outcome: Progressing

## 2024-05-27 LAB — BASIC METABOLIC PANEL WITH GFR
Anion gap: 14 (ref 5–15)
BUN: 5 mg/dL — ABNORMAL LOW (ref 6–20)
CO2: 24 mmol/L (ref 22–32)
Calcium: 8.4 mg/dL — ABNORMAL LOW (ref 8.9–10.3)
Chloride: 100 mmol/L (ref 98–111)
Creatinine, Ser: 0.64 mg/dL (ref 0.44–1.00)
GFR, Estimated: >60 mL/min (ref >60)
Glucose, Bld: 86 mg/dL (ref 70–99)
Potassium: 3.3 mmol/L — ABNORMAL LOW (ref 3.5–5.1)
Sodium: 138 mmol/L (ref 135–145)

## 2024-05-27 LAB — CULTURE, BLOOD (ROUTINE X 2)
Culture: NO GROWTH
Culture: NO GROWTH
Special Requests: ADEQUATE
Special Requests: ADEQUATE

## 2024-05-27 LAB — CBC
HCT: 29.8 % — ABNORMAL LOW (ref 36.0–46.0)
Hemoglobin: 9.9 g/dL — ABNORMAL LOW (ref 12.0–15.0)
MCH: 30.9 pg (ref 26.0–34.0)
MCHC: 33.2 g/dL (ref 30.0–36.0)
MCV: 93.1 fL (ref 80.0–100.0)
Platelets: 319 K/uL (ref 150–400)
RBC: 3.2 MIL/uL — ABNORMAL LOW (ref 3.87–5.11)
RDW: 13.3 % (ref 11.5–15.5)
WBC: 18.5 K/uL — ABNORMAL HIGH (ref 4.0–10.5)
nRBC: 0 % (ref 0.0–0.2)

## 2024-05-27 MED ORDER — SIMETHICONE 80 MG PO CHEW
80.0000 mg | CHEWABLE_TABLET | Freq: Four times a day (QID) | ORAL | Status: DC | PRN
Start: 2024-05-27 — End: 2024-05-30
  Administered 2024-05-27: 80 mg via ORAL
  Filled 2024-05-27: qty 1

## 2024-05-27 MED ORDER — BOOST / RESOURCE BREEZE PO LIQD CUSTOM
1.0000 | Freq: Three times a day (TID) | ORAL | Status: DC
  Administered 2024-05-27 – 2024-05-29 (×3): 1 via ORAL

## 2024-05-27 NOTE — Progress Notes (Signed)
 Progress Note     Subjective: Patient reports pain has improved since drain placement. Reports one episode of emesis this morning at 2:30 AM. Denies nausea currently and no other episodes of emesis. Denies BM and flatulence today. Had minimal amount of CLD.  ROS  All negative with the exception of above.  Objective: Vital signs in last 24 hours: Temp:  [98.5 F (36.9 C)-99.1 F (37.3 C)] 98.9 F (37.2 C) (09/18 0804) Pulse Rate:  [79-90] 84 (09/18 0804) Resp:  [12-19] 18 (09/18 0804) BP: (105-128)/(67-89) 122/76 (09/18 0804) SpO2:  [96 %-100 %] 98 % (09/18 0804) Last BM Date : 05/26/24  Intake/Output from previous day: 09/17 0701 - 09/18 0700 In: 5  Out: 10 [Drains:10] Intake/Output this shift: No intake/output data recorded.  PE: General: Pleasant female who is laying in bed NAD. HEENT: Head is normocephalic, atraumatic.   Heart: HR normal Lungs: Respiratory effort nonlabored Abd: Soft, ND. Generalized tenderness that is more prominent of the RLQ/central pelvis. No guarding or rebound tenderness. Drain in place with <25 of Clear/white/yellow fluid. Skin: Warm and dry Psych: A&Ox3 with an appropriate affect.    Lab Results:  Recent Labs    05/26/24 0831 05/27/24 0526  WBC 19.5* 18.5*  HGB 9.8* 9.9*  HCT 29.9* 29.8*  PLT 312 319   BMET Recent Labs    05/26/24 0831 05/27/24 0526  NA 136 138  K 3.3* 3.3*  CL 101 100  CO2 24 24  GLUCOSE 91 86  BUN 5* 5*  CREATININE 0.68 0.64  CALCIUM 8.2* 8.4*   PT/INR No results for input(s): LABPROT, INR in the last 72 hours. CMP     Component Value Date/Time   NA 138 05/27/2024 0526   K 3.3 (L) 05/27/2024 0526   CL 100 05/27/2024 0526   CO2 24 05/27/2024 0526   GLUCOSE 86 05/27/2024 0526   BUN 5 (L) 05/27/2024 0526   CREATININE 0.64 05/27/2024 0526   CALCIUM 8.4 (L) 05/27/2024 0526   PROT 7.3 05/21/2024 2222   ALBUMIN 3.6 05/21/2024 2222   AST 24 05/21/2024 2222   ALT 20 05/21/2024 2222   ALKPHOS  67 05/21/2024 2222   BILITOT 1.0 05/21/2024 2222   GFRNONAA >60 05/27/2024 0526   GFRAA >60 09/07/2018 1557   Lipase     Component Value Date/Time   LIPASE 42 05/21/2024 2222       Studies/Results: CT GUIDED PERITONEAL/RETROPERITONEAL FLUID DRAIN BY PERC CATH Result Date: 05/26/2024 INDICATION: Pelvic abscess. Hx perforated appendicitis 282027 Pelvic fluid collection [id number] EXAM: CT-GUIDED PELVIC ABSCESS DRAINAGE CATHETER PLACEMENT COMPARISON:  CT AP, 05/25/2024 AND 05/22/2024. MEDICATIONS: The patient is currently admitted to the hospital and receiving intravenous antibiotics. The antibiotics were administered within an appropriate time frame prior to the initiation of the procedure. ANESTHESIA/SEDATION: Moderate (conscious) sedation was employed during this procedure. A total of Versed  2 mg and Fentanyl  100 mcg was administered intravenously. Moderate Sedation Time: 25 minutes. The patient's level of consciousness and vital signs were monitored continuously by radiology nursing throughout the procedure under my direct supervision. CONTRAST:  None FLUOROSCOPY TIME:  CT dose; 563 mGycm COMPLICATIONS: None immediate. PROCEDURE: RADIATION DOSE REDUCTION: This exam was performed according to the departmental dose-optimization program which includes automated exposure control, adjustment of the mA and/or kV according to patient size and/or use of iterative reconstruction technique. Informed written consent was obtained from the patient and/or patient's representative after a discussion of the risks, benefits and alternatives to treatment. The patient was  placed prone on the CT gantry and a pre procedural CT was performed re-demonstrating the known abscess/fluid collection within the deep pelvis. The procedure was planned. A timeout was performed prior to the initiation of the procedure. The RIGHT gluteus was prepped and draped in the usual sterile fashion. The overlying soft tissues were anesthetized  with 1% lidocaine  with epinephrine. Appropriate trajectory was planned with the use of a 22 gauge spinal needle. An 18 gauge trocar needle was advanced into the abscess/fluid collection and a short Amplatz super stiff wire was coiled within the collection. Appropriate positioning was confirmed with a limited CT scan. The tract was serially dilated allowing placement of a 10 Fr drainage catheter. Appropriate positioning was confirmed with a limited postprocedural CT scan. 5 mL of thin purulent fluid was aspirated. The tube was connected to a bulb suction and sutured in place. A dressing was placed. The patient tolerated the procedure well without immediate post procedural complication. IMPRESSION: Successful CT-guided placement of a 10 Fr drainage catheter into the pelvis, via a RIGHT transgluteal approach. Aspiration of 5 mL of thin purulent fluid. Samples were sent to the laboratory as requested by the ordering clinical team. RECOMMENDATIONS: The patient will return to [facility]iology (VIR) for routine drainage catheter evaluation and exchange in 7 days. Thom Hall, MD [facility] Electronically Signed   By: Thom Hall M.D.   On: 05/26/2024 13:18   CT ABDOMEN PELVIS W CONTRAST Result Date: 05/25/2024 CLINICAL DATA:  Perforated appendicitis EXAM: CT ABDOMEN AND PELVIS WITH CONTRAST TECHNIQUE: Multidetector CT imaging of the abdomen and pelvis was performed using the standard protocol following bolus administration of intravenous contrast. RADIATION DOSE REDUCTION: This exam was performed according to the departmental dose-optimization program which includes automated exposure control, adjustment of the mA and/or kV according to patient size and/or use of iterative reconstruction technique. CONTRAST:  75mL OMNIPAQUE  IOHEXOL  350 MG/ML SOLN COMPARISON:  05/22/2024 FINDINGS: Lower chest: Small right pleural effusion, new. Dependent  atelectasis in the right lower lobe. Hepatobiliary: Numerous gallstones within the gallbladder. No evidence of acute cholecystitis. No focal hepatic abnormality or biliary ductal dilatation. Pancreas: No focal abnormality or ductal dilatation. Spleen: No focal abnormality.  Normal size. Adrenals/Urinary Tract: No adrenal abnormality. No focal renal abnormality. No stones or hydronephrosis. Urinary bladder is unremarkable. Stomach/Bowel: Appendix remains dilated and inflamed with surrounding stranding and extraluminal air. Overall appearance is similar prior study. Small air and fluid collection forming in the anterior pelvis measuring 2.9 x 1.8 cm. Stomach, large and small bowel grossly unremarkable. No obstruction. Vascular/Lymphatic: No evidence of aneurysm or adenopathy. Reproductive: Calcified fibroid in the fundus of the uterus. No adnexal mass. Other: Fluid in the cul-de-sac with adjacent peritoneal enhancement suggesting peritonitis or developing abscess measuring 5.3 x 3.2 cm. Musculoskeletal: No acute bony abnormality. IMPRESSION: Continued evidence of perforated appendicitis with dilated, inflamed appendix with surrounding right lower quadrant inflammation and extraluminal air. Small developing gas and fluid collection in the anterior pelvis measures 2.9 x 1.8 cm. Rounded fluid collection in the cul-de-sac with rim enhancement could reflect free fluid and peritonitis or developing abscess. Cholelithiasis. New small right pleural effusion with dependent atelectasis in the right lower lobe. Electronically Signed   By: Franky Crease M.D.   On: 05/25/2024 13:02    Anti-infectives: Anti-infectives (From admission, onward)    Start     Dose/Rate Route Frequency Ordered Stop   05/22/24 2000  metroNIDAZOLE  (FLAGYL ) IVPB 500 mg  Placed in And Linked Group   500 mg 100 mL/hr over 60 Minutes Intravenous Every 12 hours 05/22/24 0826 05/29/24 1959   05/22/24 0830  ceFEPIme  (MAXIPIME ) 2 g in sodium  chloride 0.9 % 100 mL IVPB       Placed in And Linked Group   2 g 200 mL/hr over 30 Minutes Intravenous Every 8 hours 05/22/24 0826 05/29/24 0559   05/22/24 0515  levofloxacin  (LEVAQUIN ) IVPB 750 mg        750 mg 100 mL/hr over 90 Minutes Intravenous  Once 05/22/24 0510 05/22/24 0837   05/22/24 0515  metroNIDAZOLE  (FLAGYL ) IVPB 500 mg        500 mg 100 mL/hr over 60 Minutes Intravenous  Once 05/22/24 0510 05/22/24 0703        Assessment/Plan Perforated appendicitis  - CT 9/13 w/ acute perforated appendicitis with surrounding soft tissue stranding, thickening of adjacent peritoneum and small foci of gas in the RLQ, no abscess, small volume of free fluid in pelvis  - Repeat CT from 9/16 showed continued evidence of perforated appendicitis with dilated, inflamed appendix with surrounding right lower quadrant inflammation and extraluminal air. Small developing gas and fluid collection in the anterior pelvis measures 2.9 x 1.8 cm. Rounded fluid collection in the cul-de-sac with rim enhancement could reflect free fluid and peritonitis or developing abscess. - IR placed drain into pelvic abscess on 9/17. <25 within bulb during encounter. Total output reported from 9/17-9/18 10mL - WBC 18.5 from 19.5; HGB 8.9 - Tmax 99. No tachycardic. - Will continue abx. - Will continue to follow.     FEN: CLD; feeding supplement; NaCl flush every 8 hrs VTE: Enoxaparin ; SCDs ID: [email or web address] /[email or web address]      LOS: 5 days   I reviewed nursing notes, last 24 h vitals and pain scores, last 48 h intake and output, last 24 h labs and trends, and last 24 h imaging results.  This care required moderate level of medical decision making.    Marjorie Carlyon Favre, [facility] 05/27/2024, 9:32 AM Please see Amion for pager number during day hours 7:00am-4:30pm

## 2024-05-27 NOTE — Plan of Care (Signed)
  Problem: Education: Goal: Knowledge of General Education information will improve Description: Including pain rating scale, medication(s)/side effects and non-pharmacologic comfort measures Outcome: Progressing   Problem: Health Behavior/Discharge Planning: Goal: Ability to manage health-related needs will improve Outcome: Progressing   Problem: Clinical Measurements: Goal: Will remain free from infection Outcome: Progressing   Problem: Activity: Goal: Risk for activity intolerance will decrease Outcome: Progressing   Problem: Coping: Goal: Level of anxiety will decrease Outcome: Progressing   Problem: Elimination: Goal: Will not experience complications related to urinary retention Outcome: Progressing   Problem: Pain Managment: Goal: General experience of comfort will improve and/or be controlled Outcome: Progressing   Problem: Safety: Goal: Ability to remain free from injury will improve Outcome: Progressing   Problem: Skin Integrity: Goal: Risk for impaired skin integrity will decrease Outcome: Progressing

## 2024-05-28 NOTE — Plan of Care (Signed)
  Problem: Clinical Measurements: Goal: Ability to maintain clinical measurements within normal limits will improve Outcome: Progressing Goal: Will remain free from infection Outcome: Progressing Goal: Diagnostic test results will improve Outcome: Progressing   Problem: Activity: Goal: Risk for activity intolerance will decrease Outcome: Progressing   Problem: Nutrition: Goal: Adequate nutrition will be maintained Outcome: Progressing   Problem: Elimination: Goal: Will not experience complications related to bowel motility Outcome: Progressing Goal: Will not experience complications related to urinary retention Outcome: Progressing

## 2024-05-28 NOTE — Progress Notes (Signed)
 Progress Note     Subjective: Doing well.  Dreaming of food.  ROS  All negative with the exception of above.  Objective: Vital signs in last 24 hours: Temp:  [98.2 F (36.8 C)-98.6 F (37 C)] 98.5 F (36.9 C) (09/19 0812) Pulse Rate:  [80-87] 80 (09/19 0812) Resp:  [16-18] 16 (09/19 0812) BP: (119-126)/(70-77) 119/70 (09/19 0812) SpO2:  [99 %-100 %] 99 % (09/19 0812) Last BM Date : 05/27/24  Intake/Output from previous day: 09/18 0701 - 09/19 0700 In: -  Out: 25 [Drains:25] Intake/Output this shift: Total I/O In: 240 [P.O.:240] Out: -   PE: General: Pleasant female who is laying in bed NAD. HEENT: Head is normocephalic, atraumatic.   Heart: HR normal Lungs: Respiratory effort nonlabored Abd: Soft, ND. Abdomen soft, nontender, Drain in place with clear yellow fluid. Skin: Warm and dry Psych: A&Ox3 with an appropriate affect.    Lab Results:  Recent Labs    05/26/24 0831 05/27/24 0526  WBC 19.5* 18.5*  HGB 9.8* 9.9*  HCT 29.9* 29.8*  PLT 312 319   BMET Recent Labs    05/26/24 0831 05/27/24 0526  NA 136 138  K 3.3* 3.3*  CL 101 100  CO2 24 24  GLUCOSE 91 86  BUN 5* 5*  CREATININE 0.68 0.64  CALCIUM 8.2* 8.4*   PT/INR No results for input(s): LABPROT, INR in the last 72 hours. CMP     Component Value Date/Time   NA 138 05/27/2024 0526   K 3.3 (L) 05/27/2024 0526   CL 100 05/27/2024 0526   CO2 24 05/27/2024 0526   GLUCOSE 86 05/27/2024 0526   BUN 5 (L) 05/27/2024 0526   CREATININE 0.64 05/27/2024 0526   CALCIUM 8.4 (L) 05/27/2024 0526   PROT 7.3 05/21/2024 2222   ALBUMIN 3.6 05/21/2024 2222   AST 24 05/21/2024 2222   ALT 20 05/21/2024 2222   ALKPHOS 67 05/21/2024 2222   BILITOT 1.0 05/21/2024 2222   GFRNONAA >60 05/27/2024 0526   GFRAA >60 09/07/2018 1557   Lipase     Component Value Date/Time   LIPASE 42 05/21/2024 2222       Studies/Results: No results found.   Anti-infectives: Anti-infectives (From admission,  onward)    Start     Dose/Rate Route Frequency Ordered Stop   05/22/24 2000  metroNIDAZOLE  (FLAGYL ) IVPB 500 mg       Placed in And Linked Group   500 mg 100 mL/hr over 60 Minutes Intravenous Every 12 hours 05/22/24 0826 05/29/24 1959   05/22/24 0830  ceFEPIme  (MAXIPIME ) 2 g in sodium chloride  0.9 % 100 mL IVPB       Placed in And Linked Group   2 g 200 mL/hr over 30 Minutes Intravenous Every 8 hours 05/22/24 0826 05/29/24 0559   05/22/24 0515  levofloxacin  (LEVAQUIN ) IVPB 750 mg        750 mg 100 mL/hr over 90 Minutes Intravenous  Once 05/22/24 0510 05/22/24 0837   05/22/24 0515  metroNIDAZOLE  (FLAGYL ) IVPB 500 mg        500 mg 100 mL/hr over 60 Minutes Intravenous  Once 05/22/24 0510 05/22/24 0703        Assessment/Plan Perforated appendicitis  - CT 9/13 w/ acute perforated appendicitis with surrounding soft tissue stranding, thickening of adjacent peritoneum and small foci of gas in the RLQ, no abscess, small volume of free fluid in pelvis  - Repeat CT from 9/16 showed continued evidence of perforated appendicitis with dilated,  inflamed appendix with surrounding right lower quadrant inflammation and extraluminal air. Small developing gas and fluid collection in the anterior pelvis measures 2.9 x 1.8 cm. Rounded fluid collection in the cul-de-sac with rim enhancement could reflect free fluid and peritonitis or developing abscess. - IR placed drain into pelvic abscess on 9/17 - Check labs tomorrow - Will continue abx. - Will continue to follow.  - Regular diet today, possibly discharge home tomorrow    FEN: Reg; feeding supplement; NaCl flush every 8 hrs VTE: Enoxaparin ; SCDs ID: [email or web address] /[email or web address]      LOS: 6 days   I reviewed nursing notes, last 24 h vitals and pain scores, last 48 h intake and output, last 24 h labs and trends, and last 24 h imaging results.  This care required moderate level of medical decision making.    Deward JINNY Foy, MD  [facility]  Surgery 05/28/2024, 1:22 PM Please see Amion for pager number during day hours 7:00am-4:30pm

## 2024-05-28 NOTE — Progress Notes (Incomplete)
 Referring Provider(s): Marjorie Favre, PA-C  Supervising Physician: Jenna Hacker  Patient Status:  [facility] - In-pt  Chief Complaint:  Perforated appendicitis with pelvic abscess, s/p RIGHT transgluteal drain placement on 9/17 by Dr/ Mugweru  Brief History:  ***  Subjective:  ***  Allergies: Penicillins  Medications: Prior to Admission medications   Not on File     Vital Signs: BP 121/77 (BP Location: Right Arm)   Pulse 81   Temp 98.4 F (36.9 C) (Oral)   Resp 16   Ht 5' 5 (1.651 m)   Wt 172 lb (78 kg)   SpO2 98%   BMI 28.62 kg/m   Physical Exam   Labs:  CBC: Recent Labs    05/24/24 0334 05/25/24 0903 05/26/24 0831 05/27/24 0526  WBC 21.8* 20.2* 19.5* 18.5*  HGB 10.8* 10.3* 9.8* 9.9*  HCT 32.8* 30.5* 29.9* 29.8*  PLT 283 292 312 319    COAGS: Recent Labs    05/22/24 0539  INR 1.3*    BMP: Recent Labs    05/24/24 0334 05/25/24 0903 05/26/24 0831 05/27/24 0526  NA 139 137 136 138  K 4.0 3.6 3.3* 3.3*  CL 107 103 101 100  CO2 21* 22 24 24   GLUCOSE 103* 90 91 86  BUN 6 6 5* 5*  CALCIUM 8.5* 8.2* 8.2* 8.4*  CREATININE 0.75 0.67 0.68 0.64  GFRNONAA >60 >60 >60 >60    LIVER FUNCTION TESTS: Recent Labs    05/21/24 2222  BILITOT 1.0  AST 24  ALT 20  ALKPHOS 67  PROT 7.3  ALBUMIN 3.6    Assessment and Plan:  Drain Location: {Anatomy; location abdomen:11293} Size: {Fr size:25814} Date of placement: ***  Currently to: {Drain collection device:25813} 24 hour output:  Output by Drain (mL) 05/26/24 0701 - 05/26/24 1900 05/26/24 1901 - 05/27/24 0700 05/27/24 0701 - 05/27/24 1900 05/27/24 1901 - 05/28/24 0700 05/28/24 0701 - 05/28/24 1634  Closed System Drain 1 Inferior;Right Back Bulb (JP)  10 25      Interval imaging/drain manipulation:  ***  Current examination: Flushes/aspirates easily.  Insertion site unremarkable. Suture and stat lock in place. Dressed appropriately.   Plan: Continue TID flushes with 5 cc  NS. Record output Q shift. Dressing changes QD or PRN if soiled.  Call IR APP or on call IR MD if difficulty flushing or sudden change in drain output.  Repeat imaging/possible drain injection once output < 10 mL/QD (excluding flush material). Consideration for drain removal if output is < 10 mL/QD (excluding flush material), pending discussion with the providing surgical service.  Discharge planning: Please contact IR APP or on call IR MD prior to patient d/c to ensure appropriate follow up plans are in place. Typically patient will follow up with IR clinic 10-14 days post d/c for repeat imaging/possible drain injection. IR scheduler will contact patient with date/time of appointment. Patient will need to flush drain QD with 5 cc NS, record output QD, dressing changes every 2-3 days or earlier if soiled.   IR will continue to follow - please call with questions or concerns.       Thank you for this interesting consult.  I greatly enjoyed meeting Rahel Adia Crammer and look forward to participating in their care.   Electronically Signed: Carlin DELENA Griffon, PA-C 05/28/2024, 4:34 PM     I spent a total of {Evaluation Minutes:[id number]} at the the patient's bedside AND on the patient's hospital floor or unit, greater than 50% of which  was counseling/coordinating care for ***

## 2024-05-29 LAB — BASIC METABOLIC PANEL WITH GFR
Anion gap: 10 (ref 5–15)
BUN: <5 mg/dL — ABNORMAL LOW (ref 6–20)
CO2: 25 mmol/L (ref 22–32)
Calcium: 8.4 mg/dL — ABNORMAL LOW (ref 8.9–10.3)
Chloride: 104 mmol/L (ref 98–111)
Creatinine, Ser: 0.82 mg/dL (ref 0.44–1.00)
GFR, Estimated: >60 mL/min (ref >60)
Glucose, Bld: 83 mg/dL (ref 70–99)
Potassium: 3.2 mmol/L — ABNORMAL LOW (ref 3.5–5.1)
Sodium: 139 mmol/L (ref 135–145)

## 2024-05-29 LAB — CBC
HCT: 29.8 % — ABNORMAL LOW (ref 36.0–46.0)
Hemoglobin: 9.9 g/dL — ABNORMAL LOW (ref 12.0–15.0)
MCH: 30.7 pg (ref 26.0–34.0)
MCHC: 33.2 g/dL (ref 30.0–36.0)
MCV: 92.5 fL (ref 80.0–100.0)
Platelets: 390 K/uL (ref 150–400)
RBC: 3.22 MIL/uL — ABNORMAL LOW (ref 3.87–5.11)
RDW: 13.3 % (ref 11.5–15.5)
WBC: 18.3 K/uL — ABNORMAL HIGH (ref 4.0–10.5)
nRBC: 0 % (ref 0.0–0.2)

## 2024-05-29 MED ORDER — SODIUM CHLORIDE 0.9 % IV SOLN
2.0000 g | Freq: Three times a day (TID) | INTRAVENOUS | Status: DC
  Administered 2024-05-29 – 2024-05-30 (×3): 2 g via INTRAVENOUS
  Filled 2024-05-29 (×3): qty 12.5

## 2024-05-29 MED ORDER — METRONIDAZOLE 500 MG/100ML IV SOLN
500.0000 mg | Freq: Two times a day (BID) | INTRAVENOUS | Status: DC
  Administered 2024-05-29: 500 mg via INTRAVENOUS
  Filled 2024-05-29: qty 100

## 2024-05-29 NOTE — Plan of Care (Signed)

## 2024-05-29 NOTE — Progress Notes (Signed)
 Assessment & Plan: Perforated appendicitis  - CT 9/13 w/ acute perforated appendicitis with surrounding soft tissue stranding, thickening of adjacent peritoneum and small foci of gas in the RLQ, no abscess, small volume of free fluid in pelvis  - Repeat CT from 9/16 showed continued evidence of perforated appendicitis with dilated, inflamed appendix with surrounding right lower quadrant inflammation and extraluminal air. Small developing gas and fluid collection in the anterior pelvis measures 2.9 x 1.8 cm. Rounded fluid collection in the cul-de-sac with rim enhancement could reflect free fluid and peritonitis or developing abscess. - IR placed drain into pelvic abscess on 9/17  - Regular diet - continue IV abx's - WBC still 18.3K, afebrile - instruct patient and family in drain care - patient and husband request one more day in hospital - anticipate discharge tomorrow 9/21   FEN: Reg; feeding supplement; NaCl flush every 8 hrs VTE: Enoxaparin ; SCDs ID: [email or web address] /[email or web address]          [name], MD [facility] practice Office: [phone]        Chief Complaint: Perforated appendicitis  Subjective: Patient in bed, comfortable.  Tolerating regular diet.  Husband at bedside.  Objective: Vital signs in last 24 hours: Temp:  [98.2 F (36.8 C)-98.9 F (37.2 C)] 98.4 F (36.9 C) (09/20 0758) Pulse Rate:  [76-87] 76 (09/20 0758) Resp:  [16-18] 17 (09/20 0758) BP: (110-121)/(66-82) 112/72 (09/20 0758) SpO2:  [98 %-100 %] 100 % (09/20 0758) Last BM Date : 05/28/24  Intake/Output from previous day: 09/19 0701 - 09/20 0700 In: 245 [P.O.:240] Out: -  Intake/Output this shift: Total I/O In: 5 [I.V.:5] Out: -   Physical Exam: HEENT - sclerae clear, mucous membranes moist Abdomen - soft, minimal tenderness; drain with clear serous output Ext - no edema, non-tender  Lab Results:  Recent Labs    05/27/24 0526 05/29/24 0524  WBC 18.5* 18.3*  HGB 9.9*  9.9*  HCT 29.8* 29.8*  PLT 319 390   BMET Recent Labs    05/27/24 0526 05/29/24 0524  NA 138 139  K 3.3* 3.2*  CL 100 104  CO2 24 25  GLUCOSE 86 83  BUN 5* <5*  CREATININE 0.64 0.82  CALCIUM 8.4* 8.4*   PT/INR No results for input(s): LABPROT, INR in the last 72 hours. Comprehensive Metabolic Panel:    Component Value Date/Time   NA 139 05/29/2024 0524   NA 138 05/27/2024 0526   K 3.2 (L) 05/29/2024 0524   K 3.3 (L) 05/27/2024 0526   CL 104 05/29/2024 0524   CL 100 05/27/2024 0526   CO2 25 05/29/2024 0524   CO2 24 05/27/2024 0526   BUN <5 (L) 05/29/2024 0524   BUN 5 (L) 05/27/2024 0526   CREATININE 0.82 05/29/2024 0524   CREATININE 0.64 05/27/2024 0526   GLUCOSE 83 05/29/2024 0524   GLUCOSE 86 05/27/2024 0526   CALCIUM 8.4 (L) 05/29/2024 0524   CALCIUM 8.4 (L) 05/27/2024 0526   AST 24 05/21/2024 2222   AST 19 09/07/2018 1557   ALT 20 05/21/2024 2222   ALT 17 09/07/2018 1557   ALKPHOS 67 05/21/2024 2222   ALKPHOS 60 09/07/2018 1557   BILITOT 1.0 05/21/2024 2222   BILITOT 0.3 09/07/2018 1557   PROT 7.3 05/21/2024 2222   PROT 7.7 09/07/2018 1557   ALBUMIN 3.6 05/21/2024 2222   ALBUMIN 3.9 09/07/2018 1557    Studies/Results: No results found.    [name] 05/29/2024  Patient ID: Melissa Frees  Wu, female   DOB: 1965/09/22, 58 y.o.   MRN: [id number]

## 2024-05-29 NOTE — Plan of Care (Signed)

## 2024-05-30 LAB — BASIC METABOLIC PANEL WITH GFR
Anion gap: 7 (ref 5–15)
BUN: 5 mg/dL — ABNORMAL LOW (ref 6–20)
CO2: 26 mmol/L (ref 22–32)
Calcium: 8.4 mg/dL — ABNORMAL LOW (ref 8.9–10.3)
Chloride: 106 mmol/L (ref 98–111)
Creatinine, Ser: 0.65 mg/dL (ref 0.44–1.00)
GFR, Estimated: >60 mL/min (ref >60)
Glucose, Bld: 96 mg/dL (ref 70–99)
Potassium: 3.1 mmol/L — ABNORMAL LOW (ref 3.5–5.1)
Sodium: 139 mmol/L (ref 135–145)

## 2024-05-30 LAB — CBC
HCT: 30.5 % — ABNORMAL LOW (ref 36.0–46.0)
Hemoglobin: 10.2 g/dL — ABNORMAL LOW (ref 12.0–15.0)
MCH: 30.8 pg (ref 26.0–34.0)
MCHC: 33.4 g/dL (ref 30.0–36.0)
MCV: 92.1 fL (ref 80.0–100.0)
Platelets: 407 K/uL — ABNORMAL HIGH (ref 150–400)
RBC: 3.31 MIL/uL — ABNORMAL LOW (ref 3.87–5.11)
RDW: 13.3 % (ref 11.5–15.5)
WBC: 17.8 K/uL — ABNORMAL HIGH (ref 4.0–10.5)
nRBC: 0 % (ref 0.0–0.2)

## 2024-05-30 MED ORDER — METRONIDAZOLE 500 MG PO TABS: 500.0000 mg | ORAL_TABLET | Freq: Three times a day (TID) | ORAL | 0 refills | Status: AC

## 2024-05-30 MED ORDER — CIPROFLOXACIN HCL 500 MG PO TABS: 500.0000 mg | ORAL_TABLET | Freq: Two times a day (BID) | ORAL | 0 refills | Status: AC

## 2024-05-30 NOTE — Progress Notes (Signed)
 Assessment & Plan: Perforated appendicitis  - CT 9/13 w/ acute perforated appendicitis with surrounding soft tissue stranding, thickening of adjacent peritoneum and small foci of gas in the RLQ, no abscess, small volume of free fluid in pelvis  - Repeat CT from 9/16 showed continued evidence of perforated appendicitis with dilated, inflamed appendix with surrounding right lower quadrant inflammation and extraluminal air. Small developing gas and fluid collection in the anterior pelvis measures 2.9 x 1.8 cm. Rounded fluid collection in the cul-de-sac with rim enhancement could reflect free fluid and peritonitis or developing abscess. - IR placed drain into pelvic abscess on 9/17   - Regular diet - WBC improving, 17K, afebrile - instruct patient and family in drain care, dressing changes - asked nursing to address this morning - convert to oral abx - Augmentin - Rx to pharmacy - plan follow up office visit with Dr. Lyndel in 1 week   FEN: Reg; feeding supplement; NaCl flush every 8 hrs VTE: Enoxaparin ; SCDs ID: [email or web address] /[email or web address]    Discharge home this AM.  Follow up at [facility] office 1 week.  Follow up in IR drain clinic to be arranged.        [name], MD [facility] practice Office: [phone]        Chief Complaint: Perforated appendicitis  Subjective: Patient doing well.  Ambulating, tolerating diet.  Instructed in drain care.  Objective: Vital signs in last 24 hours: Temp:  [97 F (36.1 C)-98.4 F (36.9 C)] 98.4 F (36.9 C) (09/21 9385) Pulse Rate:  [78-86] 78 (09/21 0614) Resp:  [18] 18 (09/20 2153) BP: (102-113)/(59-75) 102/59 (09/21 0614) SpO2:  [97 %-99 %] 98 % (09/21 0614) Last BM Date : 05/29/24  Intake/Output from previous day: 09/20 0701 - 09/21 0700 In: 635.3 [P.O.:240; I.V.:5; IV Piggyback:390.3] Out: 5 [Drains:5] Intake/Output this shift: No intake/output data recorded.  Physical Exam: HEENT - sclerae clear, mucous  membranes moist Abdomen - soft, non-tender; drain with thin cloudy output  Lab Results:  Recent Labs    05/29/24 0524 05/30/24 0321  WBC 18.3* 17.8*  HGB 9.9* 10.2*  HCT 29.8* 30.5*  PLT 390 407*   BMET Recent Labs    05/29/24 0524 05/30/24 0321  NA 139 139  K 3.2* 3.1*  CL 104 106  CO2 25 26  GLUCOSE 83 96  BUN <5* 5*  CREATININE 0.82 0.65  CALCIUM 8.4* 8.4*   PT/INR No results for input(s): LABPROT, INR in the last 72 hours. Comprehensive Metabolic Panel:    Component Value Date/Time   NA 139 05/30/2024 0321   NA 139 05/29/2024 0524   K 3.1 (L) 05/30/2024 0321   K 3.2 (L) 05/29/2024 0524   CL 106 05/30/2024 0321   CL 104 05/29/2024 0524   CO2 26 05/30/2024 0321   CO2 25 05/29/2024 0524   BUN 5 (L) 05/30/2024 0321   BUN <5 (L) 05/29/2024 0524   CREATININE 0.65 05/30/2024 0321   CREATININE 0.82 05/29/2024 0524   GLUCOSE 96 05/30/2024 0321   GLUCOSE 83 05/29/2024 0524   CALCIUM 8.4 (L) 05/30/2024 0321   CALCIUM 8.4 (L) 05/29/2024 0524   AST 24 05/21/2024 2222   AST 19 09/07/2018 1557   ALT 20 05/21/2024 2222   ALT 17 09/07/2018 1557   ALKPHOS 67 05/21/2024 2222   ALKPHOS 60 09/07/2018 1557   BILITOT 1.0 05/21/2024 2222   BILITOT 0.3 09/07/2018 1557   PROT 7.3 05/21/2024 2222   PROT 7.7 09/07/2018 1557  ALBUMIN 3.6 05/21/2024 2222   ALBUMIN 3.9 09/07/2018 1557    Studies/Results: No results found.    [name] 05/30/2024  Patient ID: Melissa Wu, female   DOB: 1965/09/29, 58 y.o.   MRN: [id number]

## 2024-05-30 NOTE — Discharge Instructions (Signed)
 Drain Care:   1. Flush drain once daily with at least 5 ml NS. 2. Change the dressing daily or as needed. 3. Keep the site clean and dry.  4. Empty the bulb as needed and document the date/time/volume.  Ok to shower but the site must be covered with an occlusive dressing.

## 2024-05-30 NOTE — Progress Notes (Signed)
    Referring Physician(s): Marjorie Favre, PA-C  Supervising Physician: Jenna Hacker  Patient Status:  [facility] - In-pt  Chief Complaint: Perforated appendicitis s/p right transgluteal drain placement in IR 05/26/24  Subjective: Patient sitting on the edge of the bed preparing for discharge home. She feels well.   Allergies: Penicillins  Medications: Prior to Admission medications   Medication Sig Start Date End Date Taking? Authorizing Provider  ciprofloxacin  (CIPRO ) 500 MG tablet Take 1 tablet (500 mg total) by mouth 2 (two) times daily for 14 days. 05/30/24 06/13/24 Yes Eletha Boas, MD  metroNIDAZOLE  (FLAGYL ) 500 MG tablet Take 1 tablet (500 mg total) by mouth 3 (three) times daily for 14 days. 05/30/24 06/13/24 Yes Eletha Boas, MD     Vital Signs: BP (!) 102/59   Pulse 78   Temp 98.4 F (36.9 C) (Oral)   Resp 18   Ht 5' 5 (1.651 m)   Wt 172 lb (78 kg)   SpO2 98%   BMI 28.62 kg/m   Physical Exam Constitutional:      General: She is not in acute distress.    Appearance: She is not ill-appearing.  HENT:     Mouth/Throat:     Mouth: Mucous membranes are moist.     Pharynx: Oropharynx is clear.  Pulmonary:     Effort: Pulmonary effort is normal.  Abdominal:     Tenderness: There is no abdominal tenderness.     Comments: Right transgluteal drain to suction. Dressing is clean/dry. No issues flushing the drain per bedside RN.   Skin:    General: Skin is warm and dry.  Neurological:     Mental Status: She is alert and oriented to person, place, and time.      Labs:  CBC: Recent Labs    05/26/24 0831 05/27/24 0526 05/29/24 0524 05/30/24 0321  WBC 19.5* 18.5* 18.3* 17.8*  HGB 9.8* 9.9* 9.9* 10.2*  HCT 29.9* 29.8* 29.8* 30.5*  PLT 312 319 390 407*    COAGS: Recent Labs    05/22/24 0539  INR 1.3*    BMP: Recent Labs    05/26/24 0831 05/27/24 0526 05/29/24 0524 05/30/24 0321  NA 136 138 139 139  K 3.3* 3.3* 3.2* 3.1*  CL 101 100 104 106  CO2  24 24 25 26   GLUCOSE 91 86 83 96  BUN 5* 5* <5* 5*  CALCIUM 8.2* 8.4* 8.4* 8.4*  CREATININE 0.68 0.64 0.82 0.65  GFRNONAA >60 >60 >60 >60    LIVER FUNCTION TESTS: Recent Labs    05/21/24 2222  BILITOT 1.0  AST 24  ALT 20  ALKPHOS 67  PROT 7.3  ALBUMIN 3.6    Assessment and Plan:  Perforated appendicitis s/p drain placement in IR 05/26/24  Patient has been discharged. An order has been placed for the patient to follow up with IR in 7-10 days. Drain care teaching performed by bedside RN.   Patient encouraged to call IR with any questions/concern prior to her follow up visit.   Electronically Signed: Warren Dais, AGACNP-BC 05/30/2024, 10:22 AM   I spent a total of 15 Minutes at the the patient's bedside AND on the patient's hospital floor or unit, greater than 50% of which was counseling/coordinating care for perforated appendicitis with abscess

## 2024-05-30 NOTE — Plan of Care (Signed)

## 2024-05-31 ENCOUNTER — Other Ambulatory Visit: Payer: Self-pay | Admitting: Surgery

## 2024-05-31 DIAGNOSIS — K3532 Acute appendicitis with perforation and localized peritonitis, without abscess: Secondary | ICD-10-CM

## 2024-05-31 LAB — AEROBIC/ANAEROBIC CULTURE W GRAM STAIN (SURGICAL/DEEP WOUND): Culture: NO GROWTH

## 2024-06-07 ENCOUNTER — Ambulatory Visit
Admission: RE | Admit: 2024-06-07 | Discharge: 2024-06-07 | Disposition: A | Source: Ambulatory Visit | Attending: Surgery

## 2024-06-07 ENCOUNTER — Ambulatory Visit
Admission: RE | Admit: 2024-06-07 | Discharge: 2024-06-07 | Disposition: A | Discharge status: Home / Self Care | Source: Ambulatory Visit | Attending: Surgery | Admitting: Surgery

## 2024-06-07 ENCOUNTER — Ambulatory Visit
Admission: RE | Admit: 2024-06-07 | Discharge: 2024-06-07 | Disposition: A | Discharge status: Home / Self Care | Source: Ambulatory Visit | Attending: Student | Admitting: Student

## 2024-06-07 DIAGNOSIS — K3532 Acute appendicitis with perforation and localized peritonitis, without abscess: Secondary | ICD-10-CM

## 2024-06-07 HISTORY — PX: IR RADIOLOGIST EVAL & MGMT: IMG5224

## 2024-06-07 MED ORDER — IOPAMIDOL (ISOVUE-300) INJECTION 61%
100.0000 mL | Freq: Once | INTRAVENOUS | Status: AC | PRN
Start: 2024-06-07 — End: 2024-06-07
  Administered 2024-06-07: 100 mL via INTRAVENOUS

## 2024-06-07 MED ORDER — IOPAMIDOL (ISOVUE-300) INJECTION 61%
30.0000 mL | Freq: Once | INTRAVENOUS | Status: AC | PRN
  Administered 2024-06-07: 7 mL

## 2024-06-07 NOTE — Progress Notes (Addendum)
 Referring Physician(s): Marjorie Favre, PA-C   Chief Complaint: The patient is seen in follow up today s/p perforated appendicitis with abscess and drain placement in IR 05/26/24  History of present illness:  Melissa Wu is a 58 year old female with no significant past medical history. She presented to the ED 05/22/24 with abdominal pain and vomiting. ED work up was notable for WBC 24.7, lactic acid of 2.5 and CT imaging concerning for acute, perforated appendicitis. Surgery was consulted with consideration for surgical intervention versus conservative management with antibiotics. The patient elected conservative management however she continued to be symptomatic with leukocytosis. A repeat CT scan showed a developing gas/fluid collection and IR was consulted for drain placement.   She was evaluated by the IR team and on 05/26/24 a 10 fr catheter was placed into the abscess via a right transgluteal approach. She tolerated the procedure well and the remainder of her hospital stay was uneventful. She was discharged from the hospital 05/30/24 with IR and Surgery follow up.   She presents to the IR outpatient clinic today for a drain evaluation. She is feeling great but has been unable to flush the drain for a the past few days. She notes minimal drain output - just a few milliliters - for several days. She remains on oral antibiotics.     Past Medical History:  Diagnosis Date   Osteoarthritis     No past surgical history on file.  Allergies: Penicillins  Medications: Prior to Admission medications   Medication Sig Start Date End Date Taking? Authorizing Provider  ciprofloxacin  (CIPRO ) 500 MG tablet Take 1 tablet (500 mg total) by mouth 2 (two) times daily for 14 days. 05/30/24 06/13/24  Eletha Boas, MD  metroNIDAZOLE  (FLAGYL ) 500 MG tablet Take 1 tablet (500 mg total) by mouth 3 (three) times daily for 14 days. 05/30/24 06/13/24  Eletha Boas, MD     No family history on file.  Social  History   Socioeconomic History   Marital status: Married    Spouse name: Not on file   Number of children: Not on file   Years of education: Not on file   Highest education level: Not on file  Occupational History   Not on file  Tobacco Use   Smoking status: Never   Smokeless tobacco: Never  Vaping Use   Vaping status: Never Used  Substance and Sexual Activity   Alcohol use: Yes   Drug use: Never   Sexual activity: Yes    Birth control/protection: None  Other Topics Concern   Not on file  Social History Narrative   Not on file   Social Drivers of Health   Financial Resource Strain: Not on file  Food Insecurity: No Food Insecurity (05/22/2024)   Hunger Vital Sign    Worried About Running Out of Food in the Last Year: Never true    Ran Out of Food in the Last Year: Never true  Transportation Needs: No Transportation Needs (05/22/2024)   PRAPARE - Administrator, Civil Service (Medical): No    Lack of Transportation (Non-Medical): No  Physical Activity: Not on file  Stress: Not on file  Social Connections: Not on file     Vital Signs: There were no vitals taken for this visit.  Physical Exam Constitutional:      General: She is not in acute distress.    Appearance: She is not ill-appearing.  Pulmonary:     Effort: Pulmonary effort is normal.  Abdominal:  Tenderness: There is no abdominal tenderness.     Comments: Right transgluteal drain to suction. Approximately 3-5 ml of thin, tan, watery fluid in bulb. Skin insertion site is unremarkable.   Skin:    General: Skin is warm and dry.  Neurological:     Mental Status: She is alert and oriented to person, place, and time.     Imaging: CT ABDOMEN PELVIS W CONTRAST Result Date: 06/07/2024 CLINICAL DATA:  58 year old female with history of perforated appendicitis complicated by pelvic abscess formation status post percutaneous drain placement on 05/26/2024. Drain clinic follow up. EXAM: CT ABDOMEN  AND PELVIS WITH CONTRAST TECHNIQUE: Multidetector CT imaging of the abdomen and pelvis was performed using the standard protocol following bolus administration of intravenous contrast. RADIATION DOSE REDUCTION: This exam was performed according to the departmental dose-optimization program which includes automated exposure control, adjustment of the mA and/or kV according to patient size and/or use of iterative reconstruction technique. CONTRAST:  100 mL Isovue 300, intravenous COMPARISON:  05/25/2024 FINDINGS: Lower chest: No acute abnormality. Hepatobiliary: The liver is normal in size and contour. No focal masses. The gallbladder is present with multiple similar appearing calcified gallstones, no inflammatory changes. No intra or extrahepatic biliary ductal dilation. Pancreas: Unremarkable. No pancreatic ductal dilatation or surrounding inflammatory changes. Spleen: Normal in size without focal abnormality. Adrenals/Urinary Tract: Adrenal glands are unremarkable. Kidneys are normal, without renal calculi, focal lesion, or hydronephrosis. Bladder is unremarkable. Stomach/Bowel: Stomach is within normal limits. Appendix is not definitively identified. No evidence of bowel wall thickening, distention, or inflammatory changes. Vascular/Lymphatic: No significant vascular findings are present. No enlarged abdominal or pelvic lymph nodes. Reproductive: Similar appearing calcified fundal uterine fibroid. No adnexal masses. Other: Resolution of previously visualized pelvic abscess just anterior to the proximal rectum. Unchanged position of indwelling right transgluteal pigtail drainage catheter. The drainage catheter may be kinked in the subcutaneous tissues. No ascites. Musculoskeletal: No acute or significant osseous findings. IMPRESSION: 1. Resolution of pelvic abscess with unchanged position of indwelling right transgluteal pigtail drainage catheter. 2. Cholelithiasis. Ester Sides, MD [facility] Radiology Electronically Signed   By: Ester Sides M.D.   On: 06/07/2024 09:52    Labs:  CBC: Recent Labs    05/26/24 0831 05/27/24 0526 05/29/24 0524 05/30/24 0321  WBC 19.5* 18.5* 18.3* 17.8*  HGB 9.8* 9.9* 9.9* 10.2*  HCT 29.9* 29.8* 29.8* 30.5*  PLT 312 319 390 407*    COAGS: Recent Labs    05/22/24 0539  INR 1.3*    BMP: Recent Labs    05/26/24 0831 05/27/24 0526 05/29/24 0524 05/30/24 0321  NA 136 138 139 139  K 3.3* 3.3* 3.2* 3.1*  CL 101 100 104 106  CO2 24 24 25 26   GLUCOSE 91 86 83 96  BUN 5* 5* <5* 5*  CALCIUM 8.2* 8.4* 8.4* 8.4*  CREATININE 0.68 0.64 0.82 0.65  GFRNONAA >60 >60 >60 >60    LIVER FUNCTION TESTS: Recent Labs    05/21/24 2222  BILITOT 1.0  AST 24  ALT 20  ALKPHOS 67  PROT 7.3  ALBUMIN 3.6    Assessment:  58 year old female with a history of perforated appendicitis with abscess and drain placement in IR 05/26/24  CT imaging today shows resolution of the fluid collection and contrast injection under fluoroscopy was negative for a fistula. The drain was removed and the patient was given return instructions.   She was instructed to follow up with her Surgeon as directed.  Follow up with IR as needed.   Signed: Warren JONELLE Dais, NP 06/07/2024, 10:10 AM   Please refer to Dr. Terrill attestation of this note for management and plan.   I spent a total of 30 Minutes with the patient, greater than 50% of which was counseling/coordinating care for perforated appendicitis with abscess

## 2024-06-09 ENCOUNTER — Other Ambulatory Visit

## 2024-06-25 NOTE — Discharge Summary (Signed)
    Patient ID: Melissa Wu [id number] December 27, 1965 58 y.o.  Admit date: 05/21/2024 Discharge date: 06/25/2024  Admitting Diagnosis: Appendicitis  Discharge Diagnosis Patient Active Problem List   Diagnosis Date Noted   Perforated appendicitis 05/22/2024    Consultants IR  Reason for Admission: appendicitis  Procedures IR drain  Hospital Course:  Perforated appendicitis  - CT 9/13 w/ acute perforated appendicitis with surrounding soft tissue stranding, thickening of adjacent peritoneum and small foci of gas in the RLQ, no abscess, small volume of free fluid in pelvis  - Repeat CT from 9/16 showed continued evidence of perforated appendicitis with dilated, inflamed appendix with surrounding right lower quadrant inflammation and extraluminal air. Small developing gas and fluid collection in the anterior pelvis measures 2.9 x 1.8 cm. Rounded fluid collection in the cul-de-sac with rim enhancement could reflect free fluid and peritonitis or developing abscess. - IR placed drain into pelvic abscess on 9/17   - Regular diet - WBC improving, 17K, afebrile - instruct patient and family in drain care, dressing changes - asked nursing to address this morning - convert to oral abx - Augmentin - Rx to pharmacy - plan follow up office visit with Dr. Lyndel in 1 week   FEN: Reg; feeding supplement; NaCl flush every 8 hrs VTE: Enoxaparin ; SCDs ID: Cefepime /flagyl     Discharge home this AM.  Follow up at [facility] office 1 week.   Physical Exam: Physical Exam: HEENT - sclerae clear, mucous membranes moist Abdomen - soft, non-tender; drain with thin cloudy output    Allergies as of 05/30/2024       Reactions   Penicillins Hives, Swelling        Medication List     ASK your doctor about these medications    ciprofloxacin  500 MG tablet Commonly known as: Cipro  Take 1 tablet (500 mg total) by mouth 2 (two) times daily for 14 days. Ask about: Should I take this  medication?   metroNIDAZOLE  500 MG tablet Commonly known as: FLAGYL  Take 1 tablet (500 mg total) by mouth 3 (three) times daily for 14 days. Ask about: Should I take this medication?               Discharge Care Instructions  (From admission, onward)           Start     Ordered   05/30/24 0000  Discharge wound care:       Comments: Patient instructed in flushing drain per IR recommendations.  May change dressing at drain site as needed.  May shower.   05/30/24 9094              Follow-up Information     Stechschulte, Deward PARAS, MD. Schedule an appointment as soon as possible for a visit in 1 week(s).   Specialty: Surgery Contact information: [address] [place] [address] [phone]         [facility] Follow up.   Why: Please follow up with Interventional Radiology in 7-10 days. A scheduler from our office will call you with a date/time of your appointment. Please call our office with any questions/concerns prior to your visit. Contact information: [address] [place] [address] [phone]                  Signed: Dreama GEANNIE Hanger, MD [facility] Surgery 06/25/2024, 2:17 PM

## 2024-07-01 ENCOUNTER — Ambulatory Visit: Payer: Self-pay | Admitting: Surgery

## 2024-07-13 NOTE — Patient Instructions (Signed)
 SURGICAL WAITING ROOM VISITATION  Patients having surgery or a procedure may have no more than 2 support people in the waiting area - these visitors may rotate.    Children under the age of 9 must have an adult with them who is not the patient.  Visitors with respiratory illnesses are discouraged from visiting and should remain at home.  If the patient needs to stay at the hospital during part of their recovery, the visitor guidelines for inpatient rooms apply. Pre-op nurse will coordinate an appropriate time for 1 support person to accompany patient in pre-op.  This support person may not rotate.    Please refer to the [facility] website for the visitor guidelines for Inpatients (after your surgery is over and you are in a regular room).       Your procedure is scheduled on: 07/28/24   Report to [facility] Main Entrance    Report to admitting at 8 AM   Call this number if you have problems the morning of surgery [phone]   Do not eat food :After Midnight.   After Midnight you may have the following liquids until 7:10 AM DAY OF SURGERY  Water Non-Citrus Juices (without pulp, NO RED-Apple, White grape, White cranberry) Black Coffee (NO MILK/CREAM OR CREAMERS, sugar ok)  Clear Tea (NO MILK/CREAM OR CREAMERS, sugar ok) regular and decaf                             Plain Jell-O (NO RED)                                           Fruit ices (not with fruit pulp, NO RED)                                     Popsicles (NO RED)                                                               Sports drinks like Gatorade (NO RED)                  Oral Hygiene is also important to reduce your risk of infection.                                    Remember - BRUSH YOUR TEETH THE MORNING OF SURGERY WITH YOUR REGULAR TOOTHPASTE    Stop all vitamins and herbal supplements 7 days before surgery.   Take these medicines the morning of surgery with A SIP OF WATER: none              You may not have any metal on your body including hair pins, jewelry, and body piercing             Do not wear make-up, lotions, powders, perfumes/cologne, or deodorant  Do not wear nail polish including gel and S&S, artificial/acrylic nails, or any other type of covering on natural nails including finger  and toenails. If you have artificial nails, gel coating, etc. that needs to be removed by a nail salon please have this removed prior to surgery or surgery may need to be canceled/ delayed if the surgeon/ anesthesia feels like they are unable to be safely monitored.   Do not shave  48 hours prior to surgery.             Do not bring valuables to the hospital. [facility] IS NOT             RESPONSIBLE   FOR VALUABLES.   Contacts, glasses, dentures or bridgework may not be worn into surgery.    DO NOT BRING YOUR HOME MEDICATIONS TO THE HOSPITAL. PHARMACY WILL DISPENSE MEDICATIONS LISTED ON YOUR MEDICATION LIST TO YOU DURING YOUR ADMISSION IN THE HOSPITAL!    Patients discharged on the day of surgery will not be allowed to drive home.  Someone NEEDS to stay with you for the first 24 hours after anesthesia.   Special Instructions: Bring a copy of your healthcare power of attorney and living will documents the day of surgery if you haven't scanned them before.              Please read over the following fact sheets you were given: IF YOU HAVE QUESTIONS ABOUT YOUR PRE-OP INSTRUCTIONS PLEASE [phone] [place]   If you received a COVID test during your pre-op visit  it is requested that you wear a mask when out in public, stay away from anyone that may not be feeling well and notify your surgeon if you develop symptoms. If you test positive for Covid or have been in contact with anyone that has tested positive in the last 10 days please notify you surgeon.    [facility] - Preparing for Surgery Before surgery, you can play an important role.  Because skin is not sterile, your skin needs  to be as free of germs as possible.  You can reduce the number of germs on your skin by washing with CHG (chlorahexidine gluconate) soap before surgery.  CHG is an antiseptic cleaner which kills germs and bonds with the skin to continue killing germs even after washing. Please DO NOT use if you have an allergy to CHG or antibacterial soaps.  If your skin becomes reddened/irritated stop using the CHG and inform your nurse when you arrive at Short Stay. Do not shave (including legs and underarms) for at least 48 hours prior to the first CHG shower.  You may shave your face/neck.  Please follow these instructions carefully:  1.  Shower with CHG Soap the night before surgery ONLY (DO NOT USE THE SOAP THE MORNING OF SURGERY).  2.  If you choose to wash your hair, wash your hair first as usual with your normal  shampoo.  3.  After you shampoo, rinse your hair and body thoroughly to remove the shampoo.                             4.  Use CHG as you would any other liquid soap.  You can apply chg directly to the skin and wash.  Gently with a scrungie or clean washcloth.  5.  Apply the CHG Soap to your body ONLY FROM THE NECK DOWN.   Do   not use on face/ open  Wound or open sores. Avoid contact with eyes, ears mouth and   genitals (private parts).                       Wash face,  Genitals (private parts) with your normal soap.             6.  Wash thoroughly, paying special attention to the area where your    surgery  will be performed.  7.  Thoroughly rinse your body with warm water from the neck down.  8.  DO NOT shower/wash with your normal soap after using and rinsing off the CHG Soap.                9.  Pat yourself dry with a clean towel.            10.  Wear clean pajamas.            11.  Place clean sheets on your bed the night of your first shower and do not  sleep with pets. Day of Surgery : Do not apply any CHG, lotions/deodorants the morning of surgery.  Please wear  clean clothes to the hospital/surgery center.  FAILURE TO FOLLOW THESE INSTRUCTIONS MAY RESULT IN THE CANCELLATION OF YOUR SURGERY  PATIENT SIGNATURE_________________________________  NURSE SIGNATURE__________________________________  ________________________________________________________________________

## 2024-07-13 NOTE — Progress Notes (Signed)
 COVID Vaccine received:  []  No [x]  Yes Date of any COVID positive Test in last 90 days: no PCP - Lyle Bertrand NP Cardiologist - n/a  Chest x-ray - 05/22/24 Epic EKG -  05/24/24 Epic Stress Test -  ECHO -  Cardiac Cath -   Bowel Prep - [x]  No  []   Yes ______  Pacemaker / ICD device [x]  No []  Yes   Spinal Cord Stimulator:[x]  No []  Yes       History of Sleep Apnea? [x]  No []  Yes   CPAP used?- [x]  No []  Yes    Does the patient monitor blood sugar?          [x]  No []  Yes  []  N/A  Patient has: [x]  NO Hx DM   []  Pre-DM                 []  DM1  []   DM2 Does patient have a [employer] or Dexacom? []  No []  Yes   Fasting Blood Sugar Ranges-  Checks Blood Sugar _____ times a day  GLP1 agonist / usual dose - no GLP1 instructions:  SGLT-2 inhibitors / usual dose - no SGLT-2 instructions:   Blood Thinner / Instructions:no Aspirin Instructions:no  Comments:   Activity level: Patient is able to climb a flight of stairs without difficulty; [x]  No CP  [x]  No SOB, _   Patient can  perform ADLs without assistance.   Anesthesia review:   Patient denies shortness of breath, fever, cough and chest pain at PAT appointment.  Patient verbalized understanding and agreement to the Pre-Surgical Instructions that were given to them at this PAT appointment. Patient was also educated of the need to review these PAT instructions again prior to his/her surgery.I reviewed the appropriate phone numbers to call if they have any and questions or concerns.

## 2024-07-15 ENCOUNTER — Other Ambulatory Visit: Payer: Self-pay

## 2024-07-15 ENCOUNTER — Encounter (HOSPITAL_COMMUNITY)
Admission: RE | Admit: 2024-07-15 | Discharge: 2024-07-15 | Disposition: A | Discharge status: Home / Self Care | Source: Ambulatory Visit | Attending: Surgery | Admitting: Surgery

## 2024-07-15 ENCOUNTER — Encounter (HOSPITAL_COMMUNITY): Payer: Self-pay

## 2024-07-15 VITALS — BP 123/76 | HR 68 | Resp 16 | Ht 65.0 in | Wt 168.0 lb

## 2024-07-15 DIAGNOSIS — Z01812 Encounter for preprocedural laboratory examination: Secondary | ICD-10-CM | POA: Diagnosis present

## 2024-07-15 DIAGNOSIS — Z01818 Encounter for other preprocedural examination: Secondary | ICD-10-CM

## 2024-07-15 LAB — CBC
HCT: 39 % (ref 36.0–46.0)
Hemoglobin: 12.3 g/dL (ref 12.0–15.0)
MCH: 31 pg (ref 26.0–34.0)
MCHC: 31.5 g/dL (ref 30.0–36.0)
MCV: 98.2 fL (ref 80.0–100.0)
Platelets: 314 K/uL (ref 150–400)
RBC: 3.97 MIL/uL (ref 3.87–5.11)
RDW: 13.5 % (ref 11.5–15.5)
WBC: 9.7 K/uL (ref 4.0–10.5)
nRBC: 0 % (ref 0.0–0.2)

## 2024-07-28 ENCOUNTER — Encounter (HOSPITAL_COMMUNITY): Admission: RE | Disposition: A | Payer: Self-pay | Source: Home / Self Care | Attending: Surgery

## 2024-07-28 ENCOUNTER — Other Ambulatory Visit: Payer: Self-pay

## 2024-07-28 ENCOUNTER — Encounter (HOSPITAL_COMMUNITY): Payer: Self-pay | Admitting: Surgery

## 2024-07-28 ENCOUNTER — Ambulatory Visit (HOSPITAL_COMMUNITY): Payer: Self-pay | Admitting: Physician Assistant

## 2024-07-28 ENCOUNTER — Ambulatory Visit (HOSPITAL_COMMUNITY): Admitting: Anesthesiology

## 2024-07-28 ENCOUNTER — Ambulatory Visit (HOSPITAL_COMMUNITY)
Admission: RE | Admit: 2024-07-28 | Discharge: 2024-07-29 | Disposition: A | Discharge status: Home / Self Care | Attending: Surgery | Admitting: Surgery

## 2024-07-28 DIAGNOSIS — K37 Unspecified appendicitis: Secondary | ICD-10-CM

## 2024-07-28 DIAGNOSIS — K3532 Acute appendicitis with perforation and localized peritonitis, without abscess: Secondary | ICD-10-CM | POA: Diagnosis present

## 2024-07-28 HISTORY — PX: LAPAROSCOPIC APPENDECTOMY: SHX408

## 2024-07-28 LAB — CREATININE, SERUM
Creatinine, Ser: 0.8 mg/dL (ref 0.44–1.00)
GFR, Estimated: >60 mL/min (ref >60)

## 2024-07-28 LAB — CBC
HCT: 43 % (ref 36.0–46.0)
Hemoglobin: 13.8 g/dL (ref 12.0–15.0)
MCH: 31.4 pg (ref 26.0–34.0)
MCHC: 32.1 g/dL (ref 30.0–36.0)
MCV: 97.9 fL (ref 80.0–100.0)
Platelets: 253 K/uL (ref 150–400)
RBC: 4.39 MIL/uL (ref 3.87–5.11)
RDW: 13.2 % (ref 11.5–15.5)
WBC: 8.9 K/uL (ref 4.0–10.5)
nRBC: 0 % (ref 0.0–0.2)

## 2024-07-28 SURGERY — APPENDECTOMY, LAPAROSCOPIC
Anesthesia: General

## 2024-07-28 MED ORDER — ONDANSETRON HCL 4 MG/2ML IJ SOLN
INTRAMUSCULAR | Status: DC | PRN
  Administered 2024-07-28: 4 mg via INTRAVENOUS

## 2024-07-28 MED ORDER — DROPERIDOL 2.5 MG/ML IJ SOLN: 0.6250 mg | Freq: Once | INTRAMUSCULAR | Status: DC | PRN

## 2024-07-28 MED ORDER — PROPOFOL 10 MG/ML IV BOLUS
INTRAVENOUS | Status: DC | PRN
  Administered 2024-07-28: 140 mg via INTRAVENOUS

## 2024-07-28 MED ORDER — HYDROMORPHONE HCL 1 MG/ML IJ SOLN: 0.5000 mg | INTRAMUSCULAR | Status: DC | PRN

## 2024-07-28 MED ORDER — PROCHLORPERAZINE EDISYLATE 10 MG/2ML IJ SOLN: 10.0000 mg | INTRAMUSCULAR | Status: DC | PRN

## 2024-07-28 MED ORDER — LACTATED RINGERS IV SOLN: INTRAVENOUS | Status: DC | PRN

## 2024-07-28 MED ORDER — CEFAZOLIN SODIUM-DEXTROSE 2-4 GM/100ML-% IV SOLN
2.0000 g | INTRAVENOUS | Status: AC
  Administered 2024-07-28: 2 g via INTRAVENOUS
  Filled 2024-07-28: qty 100

## 2024-07-28 MED ORDER — 0.9 % SODIUM CHLORIDE (POUR BTL) OPTIME
TOPICAL | Status: DC | PRN
  Administered 2024-07-28: 1000 mL

## 2024-07-28 MED ORDER — ROCURONIUM BROMIDE 10 MG/ML (PF) SYRINGE
PREFILLED_SYRINGE | INTRAVENOUS | Status: AC
  Filled 2024-07-28: qty 10

## 2024-07-28 MED ORDER — FENTANYL CITRATE (PF) 50 MCG/ML IJ SOSY
PREFILLED_SYRINGE | INTRAMUSCULAR | Status: AC
  Filled 2024-07-28: qty 2

## 2024-07-28 MED ORDER — LACTATED RINGERS IR SOLN
Status: DC | PRN
  Administered 2024-07-28: 1000 mL

## 2024-07-28 MED ORDER — MIDAZOLAM HCL 5 MG/5ML IJ SOLN
INTRAMUSCULAR | Status: DC | PRN
  Administered 2024-07-28: 2 mg via INTRAVENOUS

## 2024-07-28 MED ORDER — FENTANYL CITRATE (PF) 50 MCG/ML IJ SOSY
25.0000 ug | PREFILLED_SYRINGE | INTRAMUSCULAR | Status: DC | PRN
  Administered 2024-07-28 (×2): 50 ug via INTRAVENOUS

## 2024-07-28 MED ORDER — BUPIVACAINE-EPINEPHRINE (PF) 0.25% -1:200000 IJ SOLN
INTRAMUSCULAR | Status: AC
  Filled 2024-07-28: qty 30

## 2024-07-28 MED ORDER — SUGAMMADEX SODIUM 200 MG/2ML IV SOLN
INTRAVENOUS | Status: DC | PRN
  Administered 2024-07-28: 200 mg via INTRAVENOUS

## 2024-07-28 MED ORDER — CHLORHEXIDINE GLUCONATE CLOTH 2 % EX PADS: 6.0000 | MEDICATED_PAD | Freq: Once | CUTANEOUS | Status: DC

## 2024-07-28 MED ORDER — FENTANYL CITRATE (PF) 100 MCG/2ML IJ SOLN
INTRAMUSCULAR | Status: DC | PRN
  Administered 2024-07-28: 50 ug via INTRAVENOUS
  Administered 2024-07-28: 100 ug via INTRAVENOUS

## 2024-07-28 MED ORDER — DOCUSATE SODIUM 100 MG PO CAPS
100.0000 mg | ORAL_CAPSULE | Freq: Two times a day (BID) | ORAL | Status: DC
  Administered 2024-07-28 (×2): 100 mg via ORAL
  Filled 2024-07-28 (×2): qty 1

## 2024-07-28 MED ORDER — STERILE WATER FOR IRRIGATION IR SOLN
Status: DC | PRN
  Administered 2024-07-28: 1000 mL

## 2024-07-28 MED ORDER — CARMEX CLASSIC LIP BALM EX OINT
TOPICAL_OINTMENT | CUTANEOUS | Status: AC
  Filled 2024-07-28: qty 10

## 2024-07-28 MED ORDER — CHLORHEXIDINE GLUCONATE 0.12 % MT SOLN
15.0000 mL | Freq: Once | OROMUCOSAL | Status: AC
  Administered 2024-07-28: 15 mL via OROMUCOSAL

## 2024-07-28 MED ORDER — ONDANSETRON HCL 4 MG/2ML IJ SOLN
4.0000 mg | Freq: Four times a day (QID) | INTRAMUSCULAR | Status: DC | PRN
  Filled 2024-07-28: qty 2

## 2024-07-28 MED ORDER — LACTATED RINGERS IV SOLN: INTRAVENOUS | Status: DC

## 2024-07-28 MED ORDER — ACETAMINOPHEN 500 MG PO TABS
1000.0000 mg | ORAL_TABLET | ORAL | Status: AC
  Administered 2024-07-28: 1000 mg via ORAL
  Filled 2024-07-28: qty 2

## 2024-07-28 MED ORDER — ACETAMINOPHEN 10 MG/ML IV SOLN: 1000.0000 mg | Freq: Once | INTRAVENOUS | Status: DC | PRN

## 2024-07-28 MED ORDER — ENOXAPARIN SODIUM 40 MG/0.4ML IJ SOSY: 40.0000 mg | PREFILLED_SYRINGE | INTRAMUSCULAR | Status: DC

## 2024-07-28 MED ORDER — FENTANYL CITRATE (PF) 100 MCG/2ML IJ SOLN
INTRAMUSCULAR | Status: AC
  Filled 2024-07-28: qty 2

## 2024-07-28 MED ORDER — OXYCODONE HCL 5 MG PO TABS
10.0000 mg | ORAL_TABLET | ORAL | Status: DC | PRN
  Administered 2024-07-29: 10 mg via ORAL
  Filled 2024-07-28: qty 2

## 2024-07-28 MED ORDER — ACETAMINOPHEN 325 MG PO TABS
650.0000 mg | ORAL_TABLET | Freq: Four times a day (QID) | ORAL | Status: DC
  Administered 2024-07-28 – 2024-07-29 (×4): 650 mg via ORAL
  Filled 2024-07-28 (×4): qty 2

## 2024-07-28 MED ORDER — ORAL CARE MOUTH RINSE: 15.0000 mL | Freq: Once | OROMUCOSAL | Status: AC

## 2024-07-28 MED ORDER — ONDANSETRON HCL 4 MG/2ML IJ SOLN
INTRAMUSCULAR | Status: AC
  Filled 2024-07-28: qty 2

## 2024-07-28 MED ORDER — ROCURONIUM BROMIDE 100 MG/10ML IV SOLN
INTRAVENOUS | Status: DC | PRN
  Administered 2024-07-28: 50 mg via INTRAVENOUS

## 2024-07-28 MED ORDER — GABAPENTIN 300 MG PO CAPS
300.0000 mg | ORAL_CAPSULE | Freq: Three times a day (TID) | ORAL | Status: DC
  Administered 2024-07-28 – 2024-07-29 (×3): 300 mg via ORAL
  Filled 2024-07-28: qty 1
  Filled 2024-07-28: qty 3
  Filled 2024-07-28: qty 1

## 2024-07-28 MED ORDER — LIDOCAINE HCL (CARDIAC) PF 100 MG/5ML IV SOSY
PREFILLED_SYRINGE | INTRAVENOUS | Status: DC | PRN
  Administered 2024-07-28: 80 mg via INTRAVENOUS

## 2024-07-28 MED ORDER — OXYCODONE HCL 5 MG PO TABS: 5.0000 mg | ORAL_TABLET | Freq: Once | ORAL | Status: DC | PRN

## 2024-07-28 MED ORDER — MIDAZOLAM HCL 2 MG/2ML IJ SOLN
INTRAMUSCULAR | Status: AC
  Filled 2024-07-28: qty 2

## 2024-07-28 MED ORDER — OXYCODONE HCL 5 MG PO TABS
5.0000 mg | ORAL_TABLET | ORAL | Status: DC | PRN
  Administered 2024-07-28: 5 mg via ORAL
  Filled 2024-07-28: qty 1

## 2024-07-28 MED ORDER — BUPIVACAINE-EPINEPHRINE (PF) 0.25% -1:200000 IJ SOLN
INTRAMUSCULAR | Status: DC | PRN
  Administered 2024-07-28: 20 mL

## 2024-07-28 MED ORDER — PROPOFOL 10 MG/ML IV BOLUS
INTRAVENOUS | Status: AC
  Filled 2024-07-28: qty 20

## 2024-07-28 MED ORDER — DEXAMETHASONE SOD PHOSPHATE PF 10 MG/ML IJ SOLN
INTRAMUSCULAR | Status: DC | PRN
  Administered 2024-07-28: 8 mg via INTRAVENOUS

## 2024-07-28 MED ORDER — KETOROLAC TROMETHAMINE 15 MG/ML IJ SOLN
15.0000 mg | Freq: Three times a day (TID) | INTRAMUSCULAR | Status: DC
  Administered 2024-07-28 – 2024-07-29 (×3): 15 mg via INTRAVENOUS
  Filled 2024-07-28 (×3): qty 1

## 2024-07-28 MED ORDER — LIDOCAINE HCL (PF) 2 % IJ SOLN
INTRAMUSCULAR | Status: AC
  Filled 2024-07-28: qty 5

## 2024-07-28 MED ORDER — OXYCODONE HCL 5 MG/5ML PO SOLN: 5.0000 mg | Freq: Once | ORAL | Status: DC | PRN

## 2024-07-28 MED ORDER — OXYCODONE-ACETAMINOPHEN 5-325 MG PO TABS: 1.0000 | ORAL_TABLET | ORAL | 0 refills | Status: AC | PRN

## 2024-07-28 MED ORDER — SUGAMMADEX SODIUM 200 MG/2ML IV SOLN
INTRAVENOUS | Status: AC
  Filled 2024-07-28: qty 2

## 2024-07-28 MED ORDER — SCOPOLAMINE 1 MG/3DAYS TD PT72: 1.0000 | MEDICATED_PATCH | Freq: Once | TRANSDERMAL | Status: DC

## 2024-07-28 MED ORDER — HEPARIN SODIUM (PORCINE) 5000 UNIT/ML IJ SOLN
5000.0000 [IU] | Freq: Once | INTRAMUSCULAR | Status: AC
  Administered 2024-07-28: 5000 [IU] via SUBCUTANEOUS
  Filled 2024-07-28: qty 1

## 2024-07-28 SURGICAL SUPPLY — 32 items
BAG COUNTER SPONGE SURGICOUNT (BAG) ×1 IMPLANT
CHLORAPREP W/TINT 26 (MISCELLANEOUS) ×1 IMPLANT
CLIP APPLIE ROT 10 11.4 M/L (STAPLE) IMPLANT
COVER SURGICAL LIGHT HANDLE (MISCELLANEOUS) ×1 IMPLANT
DERMABOND ADVANCED .7 DNX12 (GAUZE/BANDAGES/DRESSINGS) ×1 IMPLANT
DRAIN CHANNEL 19F RND (DRAIN) IMPLANT
EVACUATOR SILICONE 100CC (DRAIN) IMPLANT
GLOVE BIO SURGEON STRL SZ7.5 (GLOVE) ×1 IMPLANT
GLOVE INDICATOR 8.0 STRL GRN (GLOVE) ×1 IMPLANT
GOWN STRL REUS W/ TWL XL LVL3 (GOWN DISPOSABLE) ×1 IMPLANT
GRASPER SUT TROCAR 14GX15 (MISCELLANEOUS) ×1 IMPLANT
IRRIGATION SUCT STRKRFLW 2 WTP (MISCELLANEOUS) IMPLANT
KIT BASIN OR (CUSTOM PROCEDURE TRAY) ×1 IMPLANT
KIT TURNOVER KIT A (KITS) ×1 IMPLANT
LIGASURE LAP MARYLAND 5MM 37CM (ELECTROSURGICAL) ×1 IMPLANT
NDL INSUFFLATION 14GA 120MM (NEEDLE) ×1 IMPLANT
NEEDLE INSUFFLATION 14GA 120MM (NEEDLE) ×1 IMPLANT
RELOAD STAPLE 60 2.6 WHT THN (STAPLE) IMPLANT
SCISSORS LAP 5X35 DISP (ENDOMECHANICALS) IMPLANT
SET TUBE SMOKE EVAC HIGH FLOW (TUBING) ×1 IMPLANT
SLEEVE ADV FIXATION 5X100MM (TROCAR) ×1 IMPLANT
SPIKE FLUID TRANSFER (MISCELLANEOUS) ×1 IMPLANT
STAPLER ECHELON LONG 60 440 (INSTRUMENTS) ×1 IMPLANT
SUT ETHILON 3 0 PS 1 (SUTURE) IMPLANT
SUT MNCRL AB 4-0 PS2 18 (SUTURE) ×1 IMPLANT
SUT VICRYL 0 UR6 27IN ABS (SUTURE) ×1 IMPLANT
SYSTEM BAG RETRIEVAL 10MM (BASKET) IMPLANT
TOWEL OR 17X26 10 PK STRL BLUE (TOWEL DISPOSABLE) IMPLANT
TRAY FOLEY MTR SLVR 14FR STAT (SET/KITS/TRAYS/PACK) IMPLANT
TRAY LAPAROSCOPIC (CUSTOM PROCEDURE TRAY) ×1 IMPLANT
TROCAR ADV FIXATION 12X100MM (TROCAR) ×1 IMPLANT
TROCAR ADV FIXATION 5X100MM (TROCAR) ×1 IMPLANT

## 2024-07-28 NOTE — Anesthesia Procedure Notes (Signed)
 Procedure Name: Intubation Date/Time: 07/28/2024 10:20 AM  Performed by: Deeann Eva BROCKS, CRNAPre-anesthesia Checklist: Patient identified, Emergency Drugs available, Suction available and Patient being monitored Patient Re-evaluated:Patient Re-evaluated prior to induction Oxygen Delivery Method: Circle System Utilized Preoxygenation: Pre-oxygenation with 100% oxygen Induction Type: IV induction Ventilation: Mask ventilation without difficulty Laryngoscope Size: Mac and 4 Grade View: Grade I Tube type: Oral Tube size: 7.0 mm Number of attempts: 1 Airway Equipment and Method: Stylet Placement Confirmation: ETT inserted through vocal cords under direct vision, positive ETCO2 and breath sounds checked- equal and bilateral Secured at: 21 cm Tube secured with: Tape Dental Injury: Teeth and Oropharynx as per pre-operative assessment

## 2024-07-28 NOTE — Discharge Instructions (Signed)
 APPENDECTOMY POST OPERATIVE INSTRUCTIONS  Thinking Clearly  The anesthesia may cause you to feel different for 1 or 2 days. Do not drive, drink alcohol, or make any big decisions for at least 2 days.  Nutrition When you wake up, you will be able to drink small amounts of liquid. If you do not feel sick, you can slowly advance your diet to regular foods. Continue to drink lots of fluids, usually about 8 to 10 glasses per day. Eat a high-fiber diet so you don't strain during bowel movements. High-Fiber Foods Foods high in fiber include beans, bran cereals and whole-grain breads, peas, dried fruit (figs, apricots, and dates), raspberries, blackberries, strawberries, sweet corn, broccoli, baked potatoes with skin, plums, pears, apples, greens, and nuts. Activity Slowly increase your activity. Be sure to get up and walk every hour or so to prevent blood clots. No heavy lifting or strenuous activity for 4 weeks following surgery to prevent hernias at your incision sites It is normal to feel tired. You may need more sleep than usual.  Get your rest but make sure to get up and move around frequently to prevent blood clots and pneumonia.  Work and Return to School You can go back to work when you feel well enough. Discuss the timing with your surgeon. You can usually go back to school or work 1 week or less after an operation for an unruptured appendix and up to 2 weeks after a ruptured appendix. If your work requires heavy lifting or strenuous activity you need to be placed on light duty for 4 weeks following surgery. You can return to gym class, sports or other physical activities 4 weeks after surgery.  Wound Care Always wash your hands before and after touching near your incision site. Do not soak in a bathtub until cleared at your follow up appointment. You may take a shower 24 hours after surgery. A small amount of drainage from the incision is normal. If the drainage is thick and yellow or  the site is red, you may have an infection, so call your surgeon. If you have a drain in one of your incisions, it will be taken out in office when the drainage stops. Steri-Strips will fall off in 7 to 10 days or they will be removed during your first office visit. If you have dermabond glue covering over the incision, allow the glue to flake off on its own. Avoid wearing tight or rough clothing. It may rub your incisions and make it harder for them to heal. Protect the new skin, especially from the sun. The sun can burn and cause darker scarring. Your scar will heal in about 4 to 6 weeks and will become softer and continue to fade over the next year.  The cosmetic appearance of the incisions will improve over the course of the first year after surgery. Sensation around your incision will return in a few weeks or months.  Bowel Movements After intestinal surgery, you may have loose watery stools for several days. If watery diarrhea lasts longer than 3 days, contact your surgeon. Pain medication (narcotics) can cause constipation. Increase the fiber in your diet with high-fiber foods if you are constipated. You can take an over the counter stool softener like Colace to avoid constipation.  Additional over the counter medications can also be used if Colace isn't sufficient (for example, Milk of Magnesia or Miralax).  Pain The amount of pain is different for each person. Some people need only 1 to   3 doses of pain control medication, while others need more. Take alternating doses of tylenol and ibuprofen around the clock for the first five days following surgery.  This will provide a baseline of pain control and help with inflammation.  Take the narcotic pain medication in addition if needed for severe pain.  Contact Your Surgeon at [phone], if you have: Pain that will not go away Pain that gets worse A fever of more than 101F (38.3C) Repeated vomiting Swelling, redness, bleeding, or  bad-smelling drainage from your wound site Strong abdominal pain No bowel movement or unable to pass gas for 3 days Watery diarrhea lasting longer than 3 days  Pain Control The goal of pain control is to minimize pain, keep you moving and help you heal. Your surgical team will work with you on your pain plan. Most often a combination of therapies and medications are used to control your pain. You may also be given medication (local anesthetic) at the surgical site. This may help control your pain for several days. Extreme pain puts extra stress on your body at a time when your body needs to focus on healing. Do not wait until your pain has reached a level "10" or is unbearable before telling your doctor or nurse. It is much easier to control pain before it becomes severe. Following a laparoscopic procedure, pain is sometimes felt in the shoulder. This is due to the gas inserted into your abdomen during the procedure. Moving and walking helps to decrease the gas and the right shoulder pain.  Use the guide below for ways to manage your post-operative pain. Learn more by going to facs.org/safepaincontrol.  How Intense Is My Pain Common Therapies to Feel Better       I hardly notice my pain, and it does not interfere with my activities.  I notice my pain and it distracts me, but I can still do activities (sitting up, walking, standing).  Non-Medication Therapies  Ice (in a bag, applied over clothing at the surgical site), elevation, rest, meditation, massage, distraction (music, TV, play) walking and mild exercise Splinting the abdomen with pillows +  Non-Opioid Medications Acetaminophen (Tylenol) Non-steroidal anti-inflammatory drugs (NSAIDS) Aspirin, Ibuprofen (Motrin, Advil) Naproxen (Aleve) Take these as needed, when you feel pain. Both acetaminophen and NSAIDs help to decrease pain and swelling (inflammation).      My pain is hard to ignore and is more noticeable even when I  rest.  My pain interferes with my usual activities.  Non-Medication Therapies  +  Non-Opioid medications  Take on a regular schedule (around-the-clock) instead of as needed. (For example, Tylenol every 6 hours at 9:00 am, 3:00 pm, 9:00 pm, 3:00 am and Motrin every 6 hours at 12:00 am, 6:00 am, 12:00 pm, 6:00 pm)         I am focused on my pain, and I am not doing my daily activities.  I am groaning in pain, and I cannot sleep. I am unable to do anything.  My pain is as bad as it could be, and nothing else matters.  Non-Medication Therapies  +  Around-the-Clock Non-Opioid Medications  +  Short-acting opioids  Opioids should be used with other medications to manage severe pain. Opioids block pain and give a feeling of euphoria (feel high). Addiction, a serious side effect of opioids, is rare with short-term (a few days) use.  Examples of short-acting opioids include: Tramadol (Ultram), Hydrocodone (Norco, Vicodin), Hydromorphone (Dilaudid), Oxycodone (Oxycontin)     The above   directions have been adapted from the American College [employer] Surgical Patient Education Program.  Please refer to the ACS website if needed: https://www.facs.org/education/patient-education/patient-resources/operations/appendectomy.   Oma Alpert, MD [facility] [address], [address], [place], [place]  [address] ?  [address] [address], [place], [place]   [address] [phone] ? [phone] ? FAX [phone] Web site: [email or web address]  

## 2024-07-28 NOTE — Plan of Care (Signed)
   Problem: Education: Goal: Knowledge of General Education information will improve Description: Including pain rating scale, medication(s)/side effects and non-pharmacologic comfort measures Outcome: Progressing   Problem: Activity: Goal: Risk for activity intolerance will decrease Outcome: Progressing

## 2024-07-28 NOTE — Anesthesia Postprocedure Evaluation (Signed)
 Anesthesia Post Note  Patient: Seena Ackley  Procedure(s) Performed: APPENDECTOMY, LAPAROSCOPIC     Patient location during evaluation: PACU Anesthesia Type: General Level of consciousness: awake and alert Pain management: pain level controlled Vital Signs Assessment: post-procedure vital signs reviewed and stable Respiratory status: spontaneous breathing, nonlabored ventilation, respiratory function stable and patient connected to nasal cannula oxygen Cardiovascular status: blood pressure returned to baseline and stable Postop Assessment: no apparent nausea or vomiting Anesthetic complications: no   No notable events documented.  Last Vitals:  Vitals:   07/28/24 1336 07/28/24 1438  BP: 116/84 118/79  Pulse: (!) 57 69  Resp: 18 18  Temp: (!) 36.4 C 36.7 C  SpO2: 99% 100%    Last Pain:  Vitals:   07/28/24 1438  TempSrc: Oral  PainSc:                  [place] Alfredo D Mirissa Lopresti

## 2024-07-28 NOTE — H&P (Signed)
   Admitting Physician: Deward PARAS Denali Becvar  Service: General Surgery  CC: Ruptured appendicitis  Subjective   HPI: Melissa Wu is an 58 y.o. female who is here for appendectomy after ruptured appendicitis  Past Medical History:  Diagnosis Date   Osteoarthritis     Past Surgical History:  Procedure Laterality Date   CESAREAN SECTION     x 2   IR RADIOLOGIST EVAL & MGMT  06/07/2024    History reviewed. No pertinent family history.  Social:  reports that she has never smoked. She has never used smokeless tobacco. She reports current alcohol use. She reports that she does not use drugs.  Allergies:  Allergies  Allergen Reactions   Penicillins Hives and Swelling    Medications: Current Outpatient Medications  Medication Instructions   ibuprofen (ADVIL) 200-400 mg, Every 6 hours PRN    ROS - all of the below systems have been reviewed with the patient and positives are indicated with bold text General: chills, fever or night sweats Eyes: blurry vision or double vision ENT: epistaxis or sore throat Allergy/Immunology: itchy/watery eyes or nasal congestion Hematologic/Lymphatic: bleeding problems, blood clots or swollen lymph nodes Endocrine: temperature intolerance or unexpected weight changes Breast: new or changing breast lumps or nipple discharge Resp: cough, shortness of breath, or wheezing CV: chest pain or dyspnea on exertion GI: as per HPI GU: dysuria, trouble voiding, or hematuria MSK: joint pain or joint stiffness Neuro: TIA or stroke symptoms Derm: pruritus and skin lesion changes Psych: anxiety and depression  Objective   PE Blood pressure 127/66, pulse (!) 57, temperature 97.9 F (36.6 C), temperature source Oral, resp. rate 16, height 5' 5 (1.651 m), weight 76.2 kg, SpO2 98%. Constitutional: NAD; conversant; no deformities Eyes: Moist conjunctiva; no lid lag; anicteric; PERRL Neck: Trachea midline; no thyromegaly Lungs: Normal respiratory  effort; no tactile fremitus CV: RRR; no palpable thrills; no pitting edema GI: Abd Soft, nontender; no palpable hepatosplenomegaly MSK: Normal range of motion of extremities; no clubbing/cyanosis Psychiatric: Appropriate affect; alert and oriented x3 Lymphatic: No palpable cervical or axillary lymphadenopathy  No results found for this or any previous visit (from the past 24 hours).  Imaging Orders  No imaging studies ordered today     Assessment and Plan   Melissa Wu is an 58 y.o. female with ruptured appendicitis here for interval laparoscopic appendectomy.  We discussed the procedure, its risks, benefits and alternatives and the patient granted consent to proceed.  Deward PARAS Foy, MD  [facility] Use [email or web address] to contact on call provider

## 2024-07-28 NOTE — Transfer of Care (Signed)
 Immediate Anesthesia Transfer of Care Note  Patient: Melissa Wu  Procedure(s) Performed: APPENDECTOMY, LAPAROSCOPIC  Patient Location: PACU  Anesthesia Type:General  Level of Consciousness: awake and alert   Airway & Oxygen Therapy: Patient Spontanous Breathing and Patient connected to face mask oxygen  Post-op Assessment: Report given to RN and Post -op Vital signs reviewed and stable  Post vital signs: Reviewed and stable  Last Vitals:  Vitals Value Taken Time  BP 124/74 07/28/24 11:15  Temp    Pulse 77 07/28/24 11:17  Resp 11 07/28/24 11:17  SpO2 100 % 07/28/24 11:17  Vitals shown include unfiled device data.  Last Pain:  Vitals:   07/28/24 0809  TempSrc: Oral         Complications: No notable events documented.

## 2024-07-28 NOTE — Anesthesia Preprocedure Evaluation (Addendum)
 Anesthesia Evaluation  Patient identified by MRN, date of birth, ID band Patient awake    Reviewed: Allergy & Precautions, NPO status , Patient's Chart, lab work & pertinent test results  Airway Mallampati: II  TM Distance: >3 FB Neck ROM: Full    Dental  (+) Teeth Intact, Dental Advisory Given   Pulmonary neg pulmonary ROS   breath sounds clear to auscultation       Cardiovascular negative cardio ROS  Rhythm:Regular Rate:Normal     Neuro/Psych negative neurological ROS  negative psych ROS   GI/Hepatic negative GI ROS, Neg liver ROS,,,  Endo/Other  negative endocrine ROS    Renal/GU negative Renal ROS     Musculoskeletal  (+) Arthritis ,    Abdominal   Peds  Hematology negative hematology ROS (+)   Anesthesia Other Findings   Reproductive/Obstetrics                              Anesthesia Physical Anesthesia Plan  ASA: 1  Anesthesia Plan: General   Post-op Pain Management: Tylenol  PO (pre-op)* and Toradol  IV (intra-op)*   Induction: Intravenous  PONV Risk Score and Plan: 4 or greater and Ondansetron , Dexamethasone , Midazolam  and Scopolamine  patch - Pre-op  Airway Management Planned: Oral ETT  Additional Equipment: None  Intra-op Plan:   Post-operative Plan: Extubation in OR  Informed Consent: I have reviewed the patients History and Physical, chart, labs and discussed the procedure including the risks, benefits and alternatives for the proposed anesthesia with the patient or authorized representative who has indicated his/her understanding and acceptance.     Dental advisory given  Plan Discussed with: CRNA  Anesthesia Plan Comments:          Anesthesia Quick Evaluation

## 2024-07-28 NOTE — Op Note (Signed)
 Patient: Melissa Wu (03-12-1966, [id number])  Date of Surgery: 07/28/2024  Preoperative Diagnosis: APPENDICITIS   Postoperative Diagnosis: APPENDICITIS   Surgical Procedure: APPENDECTOMY, LAPAROSCOPIC: 44970 (CPT)    Operative Team Members:  Surgeons and Role:    * Parmvir Boomer, Deward PARAS, MD - Primary  Margretta Brash, MD - [facility] Resident assistant  Anesthesiologist: Tilford Franky BIRCH, MD CRNA: Deeann Eva BROCKS, CRNA   Anesthesia: General   Fluids:  No intake/output data recorded.  Complications: * No complications entered in OR log *  Drains:  none   Specimen:  ID Type Source Tests Collected by Time Destination  1 : Appendix Tissue PATH Appendix SURGICAL PATHOLOGY Kamil Mchaffie, Deward PARAS, MD 07/28/2024 1039      Disposition:  PACU - hemodynamically stable.  Plan of Care: Admit for overnight observation    Indications for Procedure: Melissa Wu is a 58 y.o. female who presented with abdominal pain.  History, physical and imaging was concerning for appendicitis, so laparoscopic appendectomy was recommended for the patient.  The procedure itself, as well as the risks, benefits and alternatives were discussed with the patient.  Risks discussed included but were not limited to the risk of bleeding, infection, damage to nearby structures, need to convert to open procedure, incisional hernia, and the need for additional procedures or surgeries.  With this discussion complete and all questions answered the patient granted consent to proceed.  Findings: Inflamed appendix, adhesions throughout pelvis  Infection status: Patient: Private Patient Elective Case Case: Elective Infection Present At Time Of Surgery (PATOS): Inflamed appendix   Description of Procedure:   On the date stated above, the patient was taken to the operating room suite and placed in supine positioning with the left arm tucked.  Sequential compression devices were placed on the lower extremities to  prevent blood clots.  General endotracheal anesthesia was induced.  The patient urinated just prior to surgery so a foley catheter was not placed.  Preoperative antibiotics were given.  The patient's abdomen was prepped and draped in the usual sterile fashion.  A time-out was completed verifying the correct patient, procedure, positioning and equipment needed for the case.  We began by anesthetizing the skin with local anesthetic and then making a 5 mm incision just below the umbilicus.  We dissected through the subcutaneous tissues to the fascia.  The fascia was grasped and elevated using a Kocher clamp.  A Veress needle was inserted into the abdomen and the abdomen was insufflated to 15 mmHg.  A 5 mm trocar was inserted in this position under optical guidance and then the abdomen was inspected.  There was no trauma to the underlying viscera with initial trocar placement.  Any abnormal findings, other than inflammation in the right lower quadrant, are listed above in the findings section.  Two additional trocars were placed, one 5 mm trocar suprapubically and one 12 mm trocar in the left lower quadrant.  Adhesions between the small bowel, uterus, right fallopian tube, appendix and colon were lysed to identify these structures.  The uterus was scarred high up on the abdominal wall with previous vertical c-section incision.  These were placed under direct vision without any trauma to the underlying viscera.    The patient was then placed in head down, left side down positioning.  The appendix was identified and dissected free from its attachments to the abdominal wall, small intestine and cecum.  A window was created in the mesoappendix using blunt dissection.  We used one 60 mm white  load of the endoscopic linear stapler to divide the base of the appendix from the cecum.  Then the harmonic scalpel was used to divide the mesoappendix.  The appendix was removed through the 12 mm port site in the left lower  quadrant.  A suction irrigator was used to clean the operative field. The staple line was well formed.  There was good hemostasis at the end of the case.  At this point we directed our attention to closure.  The patient was moved back to a level position.  The 12 mm trocar site was closed at the fascial level using an 0-vicryl on a fascial suture passer.  The abdomen was desufflated.  The skin was closed using 4-0 Monocryl and dermabond.  All sponge and needle counts were correct at the end of the case.    Deward Foy, MD General, Bariatric, & Minimally Invasive Surgery [facility], [place]

## 2024-07-29 ENCOUNTER — Encounter (HOSPITAL_COMMUNITY): Payer: Self-pay | Admitting: Surgery

## 2024-07-29 DIAGNOSIS — K3532 Acute appendicitis with perforation and localized peritonitis, without abscess: Secondary | ICD-10-CM | POA: Diagnosis not present

## 2024-07-29 LAB — SURGICAL PATHOLOGY

## 2024-07-29 NOTE — Progress Notes (Signed)
 Discharge instructions (including medications) discussed with and copy provided to patient. Patient given the opportunity to ask questions. Questions clarified.

## 2024-07-29 NOTE — Discharge Summary (Signed)
  Patient ID: Melissa Wu [id number] 58 y.o. 1966-02-22  07/28/2024  Discharge date and time: 07/29/2024  Admitting Physician: Deward PARAS [name]  Discharge Physician: Deward PARAS [name]  Admission Diagnoses: Appendicitis [K37] Patient Active Problem List   Diagnosis Date Noted   Appendicitis 07/28/2024   Perforated appendicitis 05/22/2024     Discharge Diagnoses:  Patient Active Problem List   Diagnosis Date Noted   Appendicitis 07/28/2024   Perforated appendicitis 05/22/2024    Operations: Procedure(s): APPENDECTOMY, LAPAROSCOPIC  Admission Condition: good  Discharged Condition: good  Indication for Admission: Ruptured appendicitis, interval appendectomy  Hospital Course: Interval laparoscopic appendectomy  Consults: None  Significant Diagnostic Studies: None  Treatments: Surgery  Disposition: Home  Patient Instructions:  Allergies as of 07/29/2024       Reactions   Penicillins Hives, Swelling        Medication List     TAKE these medications    ibuprofen 200 MG tablet Commonly known as: ADVIL Take 200-400 mg by mouth every 6 (six) hours as needed (swelling).   oxyCODONE -acetaminophen  5-325 MG tablet Commonly known as: Percocet Take 1 tablet by mouth every 4 (four) hours as needed for severe pain (pain score 7-10).        Activity: no heavy lifting for 4 weeks Diet: regular diet Wound Care: keep wound clean and dry  Follow-up:  With Dr. Lyndel  Signed: Deward PARAS [name] General, Bariatric, & Minimally Invasive Surgery [facility], [place]   07/29/2024, 7:17 AM

## 2024-07-29 NOTE — Plan of Care (Signed)
  Problem: Education: Goal: Knowledge of General Education information will improve Description: Including pain rating scale, medication(s)/side effects and non-pharmacologic comfort measures 07/29/2024 0732 by Johnie Will HERO, RN Outcome: Adequate for Discharge 07/29/2024 0732 by Johnie Will HERO, RN Outcome: Adequate for Discharge   Problem: Health Behavior/Discharge Planning: Goal: Ability to manage health-related needs will improve 07/29/2024 0732 by Johnie Will HERO, RN Outcome: Adequate for Discharge 07/29/2024 0732 by Johnie Will HERO, RN Outcome: Adequate for Discharge   Problem: Clinical Measurements: Goal: Ability to maintain clinical measurements within normal limits will improve 07/29/2024 0732 by Johnie Will HERO, RN Outcome: Adequate for Discharge 07/29/2024 0732 by Johnie Will HERO, RN Outcome: Adequate for Discharge Goal: Will remain free from infection 07/29/2024 0732 by Johnie Will HERO, RN Outcome: Adequate for Discharge 07/29/2024 0732 by Johnie Will HERO, RN Outcome: Adequate for Discharge Goal: Diagnostic test results will improve 07/29/2024 0732 by Johnie Will HERO, RN Outcome: Adequate for Discharge 07/29/2024 0732 by Johnie Will HERO, RN Outcome: Adequate for Discharge Goal: Respiratory complications will improve 07/29/2024 0732 by Johnie Will HERO, RN Outcome: Adequate for Discharge 07/29/2024 0732 by Johnie Will HERO, RN Outcome: Adequate for Discharge Goal: Cardiovascular complication will be avoided 07/29/2024 0732 by Johnie Will HERO, RN Outcome: Adequate for Discharge 07/29/2024 0732 by Johnie Will HERO, RN Outcome: Adequate for Discharge   Problem: Activity: Goal: Risk for activity intolerance will decrease 07/29/2024 0732 by Johnie Will HERO, RN Outcome: Adequate for Discharge 07/29/2024 0732 by Johnie Will HERO, RN Outcome: Adequate for Discharge   Problem: Nutrition: Goal: Adequate  nutrition will be maintained 07/29/2024 0732 by Johnie Will HERO, RN Outcome: Adequate for Discharge 07/29/2024 0732 by Johnie Will HERO, RN Outcome: Adequate for Discharge   Problem: Coping: Goal: Level of anxiety will decrease 07/29/2024 0732 by Johnie Will HERO, RN Outcome: Adequate for Discharge 07/29/2024 0732 by Johnie Will HERO, RN Outcome: Adequate for Discharge   Problem: Elimination: Goal: Will not experience complications related to bowel motility 07/29/2024 0732 by Johnie Will HERO, RN Outcome: Adequate for Discharge 07/29/2024 0732 by Johnie Will HERO, RN Outcome: Adequate for Discharge Goal: Will not experience complications related to urinary retention 07/29/2024 0732 by Johnie Will HERO, RN Outcome: Adequate for Discharge 07/29/2024 0732 by Johnie Will HERO, RN Outcome: Adequate for Discharge   Problem: Pain Managment: Goal: General experience of comfort will improve and/or be controlled 07/29/2024 0732 by Johnie Will HERO, RN Outcome: Adequate for Discharge 07/29/2024 0732 by Johnie Will HERO, RN Outcome: Adequate for Discharge   Problem: Safety: Goal: Ability to remain free from injury will improve 07/29/2024 0732 by Johnie Will HERO, RN Outcome: Adequate for Discharge 07/29/2024 0732 by Johnie Will HERO, RN Outcome: Adequate for Discharge   Problem: Skin Integrity: Goal: Risk for impaired skin integrity will decrease 07/29/2024 0732 by Johnie Will HERO, RN Outcome: Adequate for Discharge 07/29/2024 0732 by Johnie Will HERO, RN Outcome: Adequate for Discharge
